# Patient Record
Sex: Female | Born: 1975 | Race: White | Hispanic: No | Marital: Married | Smoking: Never smoker
Health system: Southern US, Community
[De-identification: ages and names within clinical notes are randomized; demographics above are authoritative.]

## PROBLEM LIST (undated history)

## (undated) DIAGNOSIS — K219 Gastro-esophageal reflux disease without esophagitis: Secondary | ICD-10-CM

## (undated) DIAGNOSIS — N390 Urinary tract infection, site not specified: Secondary | ICD-10-CM

## (undated) DIAGNOSIS — T7840XA Allergy, unspecified, initial encounter: Secondary | ICD-10-CM

## (undated) DIAGNOSIS — D649 Anemia, unspecified: Secondary | ICD-10-CM

## (undated) HISTORY — PX: FOOT SURGERY: SHX648

## (undated) HISTORY — DX: Urinary tract infection, site not specified: N39.0

## (undated) HISTORY — PX: ABDOMINAL HYSTERECTOMY: SHX81

## (undated) HISTORY — DX: Gastro-esophageal reflux disease without esophagitis: K21.9

## (undated) HISTORY — DX: Allergy, unspecified, initial encounter: T78.40XA

## (undated) HISTORY — DX: Anemia, unspecified: D64.9

---

## 1999-08-29 ENCOUNTER — Other Ambulatory Visit: Admission: RE | Admit: 1999-08-29 | Discharge: 1999-08-29 | Payer: Self-pay | Admitting: Obstetrics and Gynecology

## 1999-12-15 ENCOUNTER — Other Ambulatory Visit: Admission: RE | Admit: 1999-12-15 | Discharge: 1999-12-15 | Payer: Self-pay | Admitting: Obstetrics and Gynecology

## 2000-01-09 ENCOUNTER — Encounter (INDEPENDENT_AMBULATORY_CARE_PROVIDER_SITE_OTHER): Payer: Self-pay

## 2000-01-09 ENCOUNTER — Other Ambulatory Visit: Admission: RE | Admit: 2000-01-09 | Discharge: 2000-01-09 | Payer: Self-pay | Admitting: Obstetrics and Gynecology

## 2000-05-03 ENCOUNTER — Other Ambulatory Visit: Admission: RE | Admit: 2000-05-03 | Discharge: 2000-05-03 | Payer: Self-pay | Admitting: Obstetrics and Gynecology

## 2000-10-01 ENCOUNTER — Other Ambulatory Visit: Admission: RE | Admit: 2000-10-01 | Discharge: 2000-10-01 | Payer: Self-pay | Admitting: Obstetrics and Gynecology

## 2001-10-18 ENCOUNTER — Other Ambulatory Visit: Admission: RE | Admit: 2001-10-18 | Discharge: 2001-10-18 | Payer: Self-pay | Admitting: Obstetrics and Gynecology

## 2002-10-31 ENCOUNTER — Other Ambulatory Visit: Admission: RE | Admit: 2002-10-31 | Discharge: 2002-10-31 | Payer: Self-pay | Admitting: Obstetrics and Gynecology

## 2003-09-02 ENCOUNTER — Other Ambulatory Visit: Admission: RE | Admit: 2003-09-02 | Discharge: 2003-09-02 | Payer: Self-pay | Admitting: Obstetrics and Gynecology

## 2004-02-19 ENCOUNTER — Inpatient Hospital Stay (HOSPITAL_COMMUNITY): Admission: AD | Admit: 2004-02-19 | Discharge: 2004-02-19 | Payer: Self-pay | Admitting: Obstetrics and Gynecology

## 2004-03-20 ENCOUNTER — Inpatient Hospital Stay (HOSPITAL_COMMUNITY): Admission: AD | Admit: 2004-03-20 | Discharge: 2004-03-20 | Payer: Self-pay | Admitting: Obstetrics and Gynecology

## 2004-03-21 ENCOUNTER — Inpatient Hospital Stay (HOSPITAL_COMMUNITY): Admission: AD | Admit: 2004-03-21 | Discharge: 2004-03-24 | Payer: Self-pay | Admitting: Obstetrics and Gynecology

## 2004-04-29 ENCOUNTER — Other Ambulatory Visit: Admission: RE | Admit: 2004-04-29 | Discharge: 2004-04-29 | Payer: Self-pay | Admitting: Obstetrics and Gynecology

## 2004-06-17 ENCOUNTER — Encounter: Admission: RE | Admit: 2004-06-17 | Discharge: 2004-06-17 | Payer: Self-pay | Admitting: Obstetrics and Gynecology

## 2005-01-13 ENCOUNTER — Ambulatory Visit: Payer: Self-pay | Admitting: Family Medicine

## 2006-02-23 ENCOUNTER — Ambulatory Visit: Payer: Self-pay | Admitting: Internal Medicine

## 2007-06-05 ENCOUNTER — Encounter (INDEPENDENT_AMBULATORY_CARE_PROVIDER_SITE_OTHER): Payer: Self-pay | Admitting: Obstetrics and Gynecology

## 2007-06-05 ENCOUNTER — Ambulatory Visit (HOSPITAL_COMMUNITY): Admission: AD | Admit: 2007-06-05 | Discharge: 2007-06-05 | Payer: Self-pay | Admitting: Obstetrics and Gynecology

## 2007-09-17 DIAGNOSIS — K219 Gastro-esophageal reflux disease without esophagitis: Secondary | ICD-10-CM

## 2007-11-06 ENCOUNTER — Ambulatory Visit: Payer: Self-pay | Admitting: Family Medicine

## 2007-12-03 ENCOUNTER — Ambulatory Visit: Payer: Self-pay | Admitting: Family Medicine

## 2007-12-03 DIAGNOSIS — J018 Other acute sinusitis: Secondary | ICD-10-CM

## 2007-12-13 ENCOUNTER — Telehealth: Payer: Self-pay | Admitting: Family Medicine

## 2008-05-18 ENCOUNTER — Inpatient Hospital Stay (HOSPITAL_COMMUNITY): Admission: RE | Admit: 2008-05-18 | Discharge: 2008-05-20 | Payer: Self-pay | Admitting: Obstetrics and Gynecology

## 2008-09-08 ENCOUNTER — Ambulatory Visit: Payer: Self-pay | Admitting: Family Medicine

## 2008-09-08 DIAGNOSIS — D239 Other benign neoplasm of skin, unspecified: Secondary | ICD-10-CM | POA: Insufficient documentation

## 2008-12-10 ENCOUNTER — Ambulatory Visit: Payer: Self-pay | Admitting: Family Medicine

## 2008-12-10 DIAGNOSIS — J019 Acute sinusitis, unspecified: Secondary | ICD-10-CM

## 2009-04-26 ENCOUNTER — Ambulatory Visit: Payer: Self-pay | Admitting: Family Medicine

## 2009-04-26 DIAGNOSIS — J209 Acute bronchitis, unspecified: Secondary | ICD-10-CM

## 2009-12-23 ENCOUNTER — Ambulatory Visit: Payer: Self-pay | Admitting: Internal Medicine

## 2009-12-23 DIAGNOSIS — J020 Streptococcal pharyngitis: Secondary | ICD-10-CM

## 2009-12-23 LAB — CONVERTED CEMR LAB: Rapid Strep: POSITIVE

## 2010-12-28 ENCOUNTER — Other Ambulatory Visit: Payer: Self-pay | Admitting: Family Medicine

## 2010-12-28 ENCOUNTER — Ambulatory Visit
Admission: RE | Admit: 2010-12-28 | Discharge: 2010-12-28 | Payer: Self-pay | Source: Home / Self Care | Attending: Family Medicine | Admitting: Family Medicine

## 2010-12-28 DIAGNOSIS — D509 Iron deficiency anemia, unspecified: Secondary | ICD-10-CM | POA: Insufficient documentation

## 2010-12-28 DIAGNOSIS — D649 Anemia, unspecified: Secondary | ICD-10-CM | POA: Insufficient documentation

## 2010-12-28 LAB — CBC WITH DIFFERENTIAL/PLATELET
Basophils Absolute: 0 10*3/uL (ref 0.0–0.1)
Basophils Relative: 0.5 % (ref 0.0–3.0)
Eosinophils Absolute: 0 10*3/uL (ref 0.0–0.7)
Eosinophils Relative: 0.3 % (ref 0.0–5.0)
HCT: 32.5 % — ABNORMAL LOW (ref 36.0–46.0)
Hemoglobin: 11 g/dL — ABNORMAL LOW (ref 12.0–15.0)
Lymphocytes Relative: 17 % (ref 12.0–46.0)
Lymphs Abs: 0.8 10*3/uL (ref 0.7–4.0)
MCHC: 33.8 g/dL (ref 30.0–36.0)
MCV: 78.5 fl (ref 78.0–100.0)
Monocytes Absolute: 0.6 10*3/uL (ref 0.1–1.0)
Monocytes Relative: 13.2 % — ABNORMAL HIGH (ref 3.0–12.0)
Neutro Abs: 3.2 10*3/uL (ref 1.4–7.7)
Neutrophils Relative %: 69 % (ref 43.0–77.0)
Platelets: 208 10*3/uL (ref 150.0–400.0)
RBC: 4.14 Mil/uL (ref 3.87–5.11)
RDW: 15.8 % — ABNORMAL HIGH (ref 11.5–14.6)
WBC: 4.7 10*3/uL (ref 4.5–10.5)

## 2010-12-29 ENCOUNTER — Telehealth: Payer: Self-pay | Admitting: Family Medicine

## 2011-01-10 NOTE — Assessment & Plan Note (Signed)
Summary: st/congestion/njr   Vital Signs:  Patient profile:   35 year old female Weight:      165 pounds Temp:     98.8 degrees F oral BP sitting:   122 / 84  (left arm) Cuff size:   regular  Vitals Entered By: Raechel Ache, RN (December 23, 2009 1:43 PM) CC: Sick since Sunday with fever 102, aches, sore throat   CC:  Sick since Sunday with fever 102, aches, and sore throat.  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  Her husband has a similar acute febrile illness.  She does have a prior history of streptococcal pharyngitis.  Rapid strep today.  Positive  Allergies: 1)  ! Pcn 2)  ! Sulfa  Past History:  Past Medical History: Reviewed history from 09/17/2007 and no changes required. UTI Sinusitis GERD  Physical Exam  General:  Well-developed,well-nourished,in no acute distress; alert,appropriate and cooperative throughout examination Head:  Normocephalic and atraumatic without obvious abnormalities. No apparent alopecia or balding. Eyes:  No corneal or conjunctival inflammation noted. EOMI. Perrla. Funduscopic exam benign, without hemorrhages, exudates or papilledema. Vision grossly normal. Ears:  External ear exam shows no significant lesions or deformities.  Otoscopic examination reveals clear canals, tympanic membranes are intact bilaterally without bulging, retraction, inflammation or discharge. Hearing is grossly normal bilaterally. Mouth:  pharyngeal erythema.  pharyngeal erythema.   Neck:  No deformities, masses, or tenderness noted. Lungs:  Normal respiratory effort, chest expands symmetrically. Lungs are clear to auscultation, no crackles or wheezes. Heart:  Normal rate and regular rhythm. S1 and S2 normal without gallop, murmur, click, rub or other extra sounds.   Impression & Recommendations:  Problem # 1:  PHARYNGITIS, STREPTOCOCCAL, ACUTE (ICD-034.0)  Her  updated medication list for this problem includes:    Clarithromycin 500 Mg Tabs (Clarithromycin) ..... One twice daily for 7 days    Amoxicillin 500 Mg Caps (Amoxicillin) ..... One capsule 3  times daily with meals  Her updated medication list for this problem includes:    Clarithromycin 500 Mg Tabs (Clarithromycin) ..... One twice daily for 7 days    Amoxicillin 500 Mg Caps (Amoxicillin) ..... One capsule 3  times daily with meals  Complete Medication List: 1)  Daily Vitamins Tabs (Multiple vitamin) .... Take one tab once daily 2)  Proair Hfa 108 (90 Base) Mcg/act Aers (Albuterol sulfate) .... 2 puffs q 4 hours as needed 3)  Clarithromycin 500 Mg Tabs (Clarithromycin) .... One twice daily for 7 days 4)  Amoxicillin 500 Mg Caps (Amoxicillin) .... One capsule 3  times daily with meals  Other Orders: Rapid Strep ([address])  Patient Instructions: 1)  Take 400-600mg  of Ibuprofen (Advil, Motrin) with food every 4-6 hours as needed for relief of pain or comfort of fever. 2)  Take your antibiotic as prescribed until ALL of it is gone, but stop if you develop a rash or swelling and contact our office as soon as possible. Prescriptions: AMOXICILLIN 500 MG CAPS (AMOXICILLIN) one capsule 3  times daily with meals  #21 x 0   Entered and Authorized by:   Gordy Savers  MD   Signed by:   Gordy Savers  MD on 12/23/2009   Method used:   Print then Give to Patient   RxID:   [id number] AMOXICILLIN 500 MG CAPS (AMOXICILLIN) one capsule 3  times daily with meals  #21 x 0   Entered and  Authorized by:   Gordy Savers  MD   Signed by:   Gordy Savers  MD on 12/23/2009   Method used:   Electronically to        [facility] (retail)       [address]       [place], [place]  [address]       Ph: [phone]       Fax: [phone]   RxID:   [id number] CLARITHROMYCIN 500 MG TABS (CLARITHROMYCIN) one twice daily for 7 days  #14 x 0   Entered  and Authorized by:   Gordy Savers  MD   Signed by:   Gordy Savers  MD on 12/23/2009   Method used:   Print then Give to Patient   RxID:   [id number]   Laboratory Results    Other Tests  Rapid Strep: positive Comments: Joanne Chars CMA  December 23, 2009 1:57 PM

## 2011-01-12 NOTE — Progress Notes (Signed)
Summary: switch antibiotic  Phone Note Call from Patient Call back at Home Phone [phone]   Caller: Patient Call For: Nelwyn Salisbury MD Summary of Call: Pt is called for lab results, and wants to know if to change meds since her throat feels worse. Initial call taken by: [facility] CMA AAMA,  December 29, 2010 3:05 PM  Follow-up for Phone Call        White blood count nl. Is mildly anemic. would continue abx and Dr. Clent Ridges to review tomorrow Follow-up by: Edwyna Perfect MD,  December 29, 2010 4:00 PM  Additional Follow-up for Phone Call Additional follow up Details #1::        Notified pt. Additional Follow-up by: Lynann Beaver CMA AAMA,  December 29, 2010 4:14 PM    Additional Follow-up for Phone Call Additional follow up Details #2::    Stop Keflex and switch to Doxycycline. Call in 100 mg two times a day for 10 days  Follow-up by: Nelwyn Salisbury MD,  December 30, 2010 1:27 PM  Additional Follow-up for Phone Call Additional follow up Details #3:: Details for Additional Follow-up Action Taken: left mess to return call. ........  Marland KitchenPura Spice, RN  December 30, 2010 2:15 PM spoke with pt rx called to [facility] .............Marland KitchenPura Spice, RN  December 30, 2010 3:35 PM  Additional Follow-up by: Pura Spice, RN,  December 30, 2010 3:34 PM  New/Updated Medications: DOXYCYCLINE HYCLATE 100 MG CAPS (DOXYCYCLINE HYCLATE) Take 1 tab twice a day Prescriptions: DOXYCYCLINE HYCLATE 100 MG CAPS (DOXYCYCLINE HYCLATE) Take 1 tab twice a day  #20 x 0   Entered by:   Pura Spice, RN   Authorized by:   Nelwyn Salisbury MD   Signed by:   Pura Spice, RN on 12/30/2010   Method used:   Electronically to        [facility] (retail)       [address]       [place], [place]  [address]       Ph: [phone]       Fax: [phone]   RxID:   [id number]

## 2011-01-12 NOTE — Assessment & Plan Note (Signed)
Summary: sorethroat/body aches/?strep/fever/cjr   Vital Signs:  Patient profile:   35 year old female Weight:      166 pounds BMI:     25.33 O2 Sat:      99 % Temp:     100 degrees F Pulse rate:   93 / minute BP sitting:   122 / 78  (left arm) Cuff size:   regular  Vitals Entered By: Pura Spice, RN (December 28, 2010 10:43 AM) CC: chills achy sore throat wants hemoglobin ck's states her children has had strep past 2 wks    History of Present Illness: Here for 2 weeks of generalized fatigue, and then the onset 5 days ago of fevers, aches, HA, and a ST. No cough or NVD. No rashes. One of her children developed test positive strep throat  a week ago, and another child developed hand, foot, and mouth disease a few days ago. On fluids and Motrin. She does have a hx of anemia.   Allergies: 1)  ! Sulfa  Past History:  Past Medical History: UTI Sinusitis GERD Anemia-iron deficiency  Review of Systems  The patient denies anorexia, weight loss, weight gain, vision loss, decreased hearing, hoarseness, chest pain, syncope, dyspnea on exertion, peripheral edema, prolonged cough, hemoptysis, abdominal pain, melena, hematochezia, severe indigestion/heartburn, hematuria, incontinence, genital sores, muscle weakness, suspicious skin lesions, transient blindness, difficulty walking, depression, unusual weight change, abnormal bleeding, enlarged lymph nodes, angioedema, breast masses, and testicular masses.    Physical Exam  General:  Well-developed,well-nourished,in no acute distress; alert,appropriate and cooperative throughout examination Head:  Normocephalic and atraumatic without obvious abnormalities. No apparent alopecia or balding. Eyes:  No corneal or conjunctival inflammation noted. EOMI. Perrla. Funduscopic exam benign, without hemorrhages, exudates or papilledema. Vision grossly normal. Ears:  External ear exam shows no significant lesions or deformities.  Otoscopic examination  reveals clear canals, tympanic membranes are intact bilaterally without bulging, retraction, inflammation or discharge. Hearing is grossly normal bilaterally. Nose:  External nasal examination shows no deformity or inflammation. Nasal mucosa are pink and moist without lesions or exudates. Mouth:  OP is a bit red with no exudates Neck:  No deformities, masses, or tenderness noted. Lungs:  Normal respiratory effort, chest expands symmetrically. Lungs are clear to auscultation, no crackles or wheezes.   Impression & Recommendations:  Problem # 1:  PHARYNGITIS, STREPTOCOCCAL, ACUTE (ICD-034.0)  The following medications were removed from the medication list:    Clarithromycin 500 Mg Tabs (Clarithromycin) ..... One twice daily for 7 days    Amoxicillin 500 Mg Caps (Amoxicillin) ..... One capsule 3  times daily with meals Her updated medication list for this problem includes:    Keflex 500 Mg Caps (Cephalexin) .Marland Kitchen... Three times a day  Problem # 2:  ANEMIA (ICD-285.9)  Orders: Venipuncture ([address]) TLB-CBC Platelet - w/Differential (85025-CBCD)  Complete Medication List: 1)  Daily Vitamins Tabs (Multiple vitamin) .... Take one tab once daily 2)  Proair Hfa 108 (90 Base) Mcg/act Aers (Albuterol sulfate) .... 2 puffs q 4 hours as needed 3)  Keflex 500 Mg Caps (Cephalexin) .... Three times a day  Patient Instructions: 1)  Please schedule a follow-up appointment as needed .  Prescriptions: KEFLEX 500 MG CAPS (CEPHALEXIN) three times a day  #30 x 0   Entered and Authorized by:   Nelwyn Salisbury MD   Signed by:   Nelwyn Salisbury MD on 12/28/2010   Method used:   Electronically to  [facility]  [address] [phone][facility] (retail)       [address] [place], [place]  [address]       Ph: [phone] or [phone]       Fax: [phone]   RxID:   [id number]    Orders Added: 1)  Venipuncture [66440] 2)  TLB-CBC Platelet - w/Differential [85025-CBCD] 3)  Est. Patient Level IV  [34742]  Appended Document: Orders Update    Clinical Lists Changes  Orders: Added new Service order of Specimen Handling (59563) - Signed

## 2011-04-25 NOTE — Op Note (Signed)
NAME:  Christy Chang, Christy Chang                  ACCOUNT NO.:  [id number]   MEDICAL RECORD NO.:  [id number]          PATIENT TYPE:  AMB   LOCATION:  SDC                           FACILITY:  [facility]   PHYSICIAN:  Michelle L. Grewal, M.D.DATE OF BIRTH:  [birth year]   DATE OF PROCEDURE:  06/05/2007  DATE OF DISCHARGE:                               OPERATIVE REPORT   PREOPERATIVE DIAGNOSIS:  Incomplete abortion.   POSTOPERATIVE DIAGNOSIS:  Incomplete abortion.   PROCEDURE:  Dilatation and evacuation.   SURGEON:  Dr. Vincente Poli.   ANESTHESIA:  MAC with paracervical.   SPECIMENS:  Products of conception.   ESTIMATED BLOOD LOSS:  About 1000 mL preoperatively,  minimal EBL at the  time of surgery.   COMPLICATIONS:  None.   PROCEDURE:  The patient is a 35 year old, G2, P1 who presented to the  office yesterday for a routine OB visit.  Ultrasound revealed missed AB  measuring about 8 to 9 weeks.  The patient was scheduled to have a D&E  today at 12:00. However, this morning at approximately 2:00 a.m., she  started having acute onset of severe lower abdominal pain with heavy  bleeding.  The bleeding was so significant that when she arrived to the  triage area at approximate 8:00 morning she had blood that was caked to  her toes and feet.  Upon arrival, we went ahead and started an IV and  drew a CBC.  We went ahead and took her back to the operating room  approximately 3 hours early because of the significant bleeding.  After  the patient was taken to the operating room, she was then given  anesthesia and placed in the lithotomy position.  She was prepped and  draped.  The entire vagina was full of significant clots, approximately  500-700 mL.  Those clots were removed.  A speculum was placed in the  vagina, and the cervix was grasped with a tenaculum.  A bladder amount  of products of conception were noted to be extruding from the cervical  os.  Those were gently teased out using ring forceps.  A  paracervical  block was performed.  The cervix was about 2 cm dilated.  A #7 suction  cannula was inserted into the uterus.  The uterus was thoroughly  curetted, suctioned of tissue with products consistent with products of  conception.  We then removed the suction cannula, and a sharp curettage  was performed.  All of the uterine cavity appeared to be clean.  A final  suction curettage was performed with scant tissue obtained.  There was  no vaginal bleeding noted at the end of this.  Approximately half of the  tissue was sent for  chromosome analysis.  The other half was sent for routine pathologic  analysis.  All instruments were removed from the vagina.  Vaginal  bleeding was minimal.  Sponge, lap and instrument counts were correct  x2.  The patient went to recovery room in stable condition.      Michelle L. Vincente Poli, M.D.  Electronically Signed     MLG/MEDQ  D:  06/05/2007  T:  06/05/2007  Job:  [id number]

## 2011-06-29 ENCOUNTER — Encounter: Payer: Self-pay | Admitting: Family Medicine

## 2011-06-29 ENCOUNTER — Ambulatory Visit (INDEPENDENT_AMBULATORY_CARE_PROVIDER_SITE_OTHER): Payer: BC Managed Care – PPO | Admitting: Family Medicine

## 2011-06-29 VITALS — BP 104/68 | HR 89 | Temp 98.9°F | Wt 165.0 lb

## 2011-06-29 DIAGNOSIS — J40 Bronchitis, not specified as acute or chronic: Secondary | ICD-10-CM

## 2011-06-29 MED ORDER — AZITHROMYCIN 250 MG PO TABS: ORAL_TABLET | ORAL | Status: AC

## 2011-06-29 MED ORDER — HYDROCODONE-HOMATROPINE 5-1.5 MG/5ML PO SYRP: 5.0000 mL | ORAL_SOLUTION | ORAL | Status: AC | PRN

## 2011-06-29 NOTE — Progress Notes (Signed)
  Subjective:    Patient ID: Christy Chang, female    DOB: 1976/04/18, 35 y.o.   MRN: [id number]  HPI Here for one week of chest tightness and a dry cough. No fever. She started with head congestion but this has resolved.   Review of Systems  Constitutional: Negative.   HENT: Negative.   Eyes: Negative.   Respiratory: Positive for cough and chest tightness.        Objective:   Physical Exam  Constitutional: She appears well-developed and well-nourished.  HENT:  Right Ear: External ear normal.  Left Ear: External ear normal.  Nose: Nose normal.  Mouth/Throat: Oropharynx is clear and moist. No oropharyngeal exudate.  Eyes: Conjunctivae are normal. Pupils are equal, round, and reactive to light.  Neck: Normal range of motion. Neck supple.  Pulmonary/Chest: Effort normal and breath sounds normal.  Lymphadenopathy:    She has no cervical adenopathy.          Assessment & Plan:  Rest, fluids

## 2011-07-31 ENCOUNTER — Telehealth: Payer: Self-pay | Admitting: *Deleted

## 2011-07-31 NOTE — Telephone Encounter (Signed)
Pt has severe dental phobia, and would like Dr. Clent Ridges to call in Valium or some RX to help her with anxiety on her next dental appt.

## 2011-08-01 MED ORDER — DIAZEPAM 5 MG PO TABS: 5.0000 mg | ORAL_TABLET | Freq: Three times a day (TID) | ORAL | Status: AC | PRN

## 2011-08-01 NOTE — Telephone Encounter (Signed)
Call in valium 5 mg to take q 6 hours prn anxiety, #30 with no rf

## 2011-08-01 NOTE — Telephone Encounter (Signed)
Script was called in and left message for pt.

## 2011-09-07 LAB — CBC
Hemoglobin: 11.7 — ABNORMAL LOW
MCHC: 33.8
MCV: 79.4
Platelets: 240
RDW: 15.8 — ABNORMAL HIGH
RDW: 16 — ABNORMAL HIGH
WBC: 11.9 — ABNORMAL HIGH

## 2011-09-27 LAB — CBC
HCT: 33.1 — ABNORMAL LOW
Platelets: 271
WBC: 8.4

## 2011-09-27 LAB — SAMPLE TO BLOOD BANK

## 2011-11-09 ENCOUNTER — Telehealth: Payer: Self-pay | Admitting: Family Medicine

## 2011-11-09 NOTE — Telephone Encounter (Signed)
She needs an OV to discuss this  

## 2011-11-09 NOTE — Telephone Encounter (Signed)
Pt called and said that she had discussed leg pain a while back and pt said that she is experiencing numbness and tingling in both legs. Pt is wondering what Dr Clent Ridges would recommend for pain? Pls call in script to [facility] in [place]. If pt needs ov, pls advise.

## 2011-11-09 NOTE — Telephone Encounter (Signed)
Left voice message.

## 2011-11-14 ENCOUNTER — Ambulatory Visit (INDEPENDENT_AMBULATORY_CARE_PROVIDER_SITE_OTHER): Payer: BC Managed Care – PPO | Admitting: Family Medicine

## 2011-11-14 ENCOUNTER — Encounter: Payer: Self-pay | Admitting: Family Medicine

## 2011-11-14 VITALS — BP 124/84 | HR 99 | Temp 98.7°F | Wt 171.0 lb

## 2011-11-14 DIAGNOSIS — G629 Polyneuropathy, unspecified: Secondary | ICD-10-CM

## 2011-11-14 DIAGNOSIS — J329 Chronic sinusitis, unspecified: Secondary | ICD-10-CM

## 2011-11-14 DIAGNOSIS — G589 Mononeuropathy, unspecified: Secondary | ICD-10-CM

## 2011-11-14 LAB — CBC WITH DIFFERENTIAL/PLATELET
Basophils Relative: 0.5 % (ref 0.0–3.0)
Eosinophils Relative: 4.9 % (ref 0.0–5.0)
Hemoglobin: 10.7 g/dL — ABNORMAL LOW (ref 12.0–15.0)
Lymphocytes Relative: 50.7 % — ABNORMAL HIGH (ref 12.0–46.0)
Monocytes Relative: 11.9 % (ref 3.0–12.0)
Neutro Abs: 0.9 10*3/uL — ABNORMAL LOW (ref 1.4–7.7)
Neutrophils Relative %: 32 % — ABNORMAL LOW (ref 43.0–77.0)
RBC: 4.02 Mil/uL (ref 3.87–5.11)
WBC: 3 10*3/uL — ABNORMAL LOW (ref 4.5–10.5)

## 2011-11-14 LAB — BASIC METABOLIC PANEL
GFR: 130.68 mL/min (ref >60.00)
Glucose, Bld: 90 mg/dL (ref 70–99)
Potassium: 4 mEq/L (ref 3.5–5.1)
Sodium: 139 mEq/L (ref 135–145)

## 2011-11-14 LAB — TSH: TSH: 0.72 u[IU]/mL (ref 0.35–5.50)

## 2011-11-14 MED ORDER — AZITHROMYCIN 250 MG PO TABS: ORAL_TABLET | ORAL | Status: AC

## 2011-11-14 NOTE — Progress Notes (Signed)
  Subjective:    Patient ID: Christy Chang, female    DOB: [birth year], 35 y.o.   MRN: [id number]  HPI Here for 2 issues. First 6 months ago she developed burning and tingling in both lower legs from the knees down which is worse when she sits down and better when she is up walking around. No swelling. She does have some intermittent low back pain. Also for 5 days she has had a stuffy head, PND, and coughing up green sputum. No fever.    Review of Systems  Constitutional: Negative.   HENT: Positive for congestion, postnasal drip and sinus pressure.   Eyes: Negative.   Respiratory: Positive for cough.   Neurological: Negative.        Objective:   Physical Exam  Constitutional: She is oriented to person, place, and time. She appears well-developed and well-nourished.  HENT:  Right Ear: External ear normal.  Left Ear: External ear normal.  Nose: Nose normal.  Mouth/Throat: Oropharynx is clear and moist. No oropharyngeal exudate.  Eyes: Conjunctivae are normal.  Neck: No thyromegaly present.  Pulmonary/Chest: Effort normal and breath sounds normal.  Musculoskeletal: She exhibits no edema and no tenderness.  Lymphadenopathy:    She has no cervical adenopathy.  Neurological: She is alert and oriented to person, place, and time. No cranial nerve deficit.          Assessment & Plan:  The leg tingling seems to be a mechanical pinching of the legs, so I advised her to get more exercise and to lose weight. Get labs to rule out neurolpathy. Treat the sinusitis with a Zpack.

## 2011-11-16 ENCOUNTER — Telehealth: Payer: Self-pay | Admitting: Family Medicine

## 2011-11-16 NOTE — Telephone Encounter (Signed)
PT requesting results of labs please contact

## 2011-11-17 ENCOUNTER — Encounter: Payer: Self-pay | Admitting: Family Medicine

## 2011-11-17 MED ORDER — FERROUS SULFATE 325 (65 FE) MG PO TABS: 325.0000 mg | ORAL_TABLET | Freq: Two times a day (BID) | ORAL | Status: DC

## 2011-11-17 NOTE — Progress Notes (Signed)
Quick Note:  Spoke with pt, sent script e-scribe, put a copy of results in mail. ______

## 2011-11-17 NOTE — Progress Notes (Signed)
Addended by: Aniceto Boss A on: 11/17/2011 08:27 AM   Modules accepted: Orders

## 2011-11-17 NOTE — Telephone Encounter (Signed)
Spoke with pt and gave results. 

## 2011-11-21 ENCOUNTER — Telehealth: Payer: Self-pay

## 2011-11-21 ENCOUNTER — Other Ambulatory Visit: Payer: Self-pay

## 2011-11-21 NOTE — Telephone Encounter (Signed)
Pt was seen last week and has finished her z pak but still has a bad cough. Pt would like to know what she can take during the day so that she can function.Pls advise.

## 2011-11-23 NOTE — Telephone Encounter (Signed)
Left message on machine For pt abourt delsym- was instructed to call back if she doesn't improve

## 2011-11-23 NOTE — Telephone Encounter (Signed)
Take Delsym OTC

## 2011-12-06 ENCOUNTER — Encounter: Payer: Self-pay | Admitting: Family Medicine

## 2011-12-06 ENCOUNTER — Ambulatory Visit (INDEPENDENT_AMBULATORY_CARE_PROVIDER_SITE_OTHER): Payer: BC Managed Care – PPO | Admitting: Family Medicine

## 2011-12-06 VITALS — BP 110/60 | Temp 98.6°F | Wt 172.0 lb

## 2011-12-06 DIAGNOSIS — J40 Bronchitis, not specified as acute or chronic: Secondary | ICD-10-CM

## 2011-12-06 DIAGNOSIS — R05 Cough: Secondary | ICD-10-CM

## 2011-12-06 MED ORDER — PREDNISONE 20 MG PO TABS: 20.0000 mg | ORAL_TABLET | Freq: Every day | ORAL | Status: AC

## 2011-12-06 MED ORDER — GUAIFENESIN-CODEINE 100-10 MG/5ML PO SYRP: 5.0000 mL | ORAL_SOLUTION | Freq: Three times a day (TID) | ORAL | Status: AC | PRN

## 2011-12-06 NOTE — Progress Notes (Signed)
  Subjective:    Patient ID: Christy Chang, female    DOB: 16-Dec-1975, 35 y.o.   MRN: [id number]  HPI 35 year old white female, nonsmoker, patient of Dr. Clent Ridges is in today with 3 weeks of cough. She was seen December 4 and treated with a Z-Pak for bronchitis and has been taking Delsym. Overall, her congestion is much better but the cough continues to linger.   Review of Systems  Constitutional: Negative.   HENT: Negative.   Eyes: Negative.   Respiratory: Positive for cough.   Cardiovascular: Negative.   Genitourinary: Negative.   Psychiatric/Behavioral: Negative.    Past Medical History  Diagnosis Date  . Allergy   . GERD (gastroesophageal reflux disease)   . Anemia   . UTI (lower urinary tract infection)     History   Social History  . Marital Status: Married    Spouse Name: N/A    Number of Children: N/A  . Years of Education: N/A   Occupational History  . Not on file.   Social History Main Topics  . Smoking status: Never Smoker   . Smokeless tobacco: Never Used  . Alcohol Use: 0.5 oz/week    1 drink(s) per week  . Drug Use: No  . Sexually Active: Not on file   Other Topics Concern  . Not on file   Social History Narrative  . No narrative on file    Past Surgical History  Procedure Date  . Foot surgery     Family History  Problem Relation Age of Onset  . Multiple sclerosis    . Cancer      colon 1st degree relative <60  . Hypertension    . Kidney disease    . Lung cancer    . Ulcers      Allergies  Allergen Reactions  . Sulfa Antibiotics     Current Outpatient Prescriptions on File Prior to Visit  Medication Sig Dispense Refill  . ferrous sulfate 325 (65 FE) MG tablet Take 1 tablet (325 mg total) by mouth 2 (two) times daily.  60 tablet  11  . Multiple Vitamin (MULTIVITAMIN) tablet Take 1 tablet by mouth daily.        . diazepam (VALIUM) 5 MG tablet Take 1 tablet (5 mg total) by mouth every 8 (eight) hours as needed for anxiety or sleep.  30  tablet  0    BP 110/60  Temp(Src) 98.6 F (37 C) (Oral)  Wt 172 lb (78.019 kg)  LMP 12/02/2012chart   Objective:   Physical Exam  Constitutional: She is oriented to person, place, and time.  HENT:  Head: Normocephalic.  Right Ear: External ear normal.  Left Ear: External ear normal.  Neck: Normal range of motion. Neck supple.  Cardiovascular: Normal rate, regular rhythm and normal heart sounds.   Pulmonary/Chest: Effort normal and breath sounds normal.  Musculoskeletal: Normal range of motion.  Neurological: She is alert and oriented to person, place, and time.  Skin: Skin is warm and dry.          Assessment & Plan:  Assessment: Acute bronchitis  Plan: Advised patient that in treating bronchitis her cough can linger several weeks beyond antibiotic therapy. Prednisone 60 mg every morning x5 days. Robitussin-AC 1 teaspoon 3 times a day when necessary cough. Warned of drowsiness. Call if symptoms worsen or persist. Recheck as scheduled or when necessary.

## 2011-12-06 NOTE — Patient Instructions (Signed)

## 2012-04-27 ENCOUNTER — Ambulatory Visit (INDEPENDENT_AMBULATORY_CARE_PROVIDER_SITE_OTHER): Payer: BC Managed Care – PPO | Admitting: Internal Medicine

## 2012-04-27 VITALS — BP 121/80 | HR 71 | Temp 98.1°F | Resp 18 | Ht 65.75 in | Wt 185.0 lb

## 2012-04-27 DIAGNOSIS — R05 Cough: Secondary | ICD-10-CM

## 2012-04-27 MED ORDER — HYDROCODONE-ACETAMINOPHEN 7.5-500 MG/15ML PO SOLN: 5.0000 mL | Freq: Four times a day (QID) | ORAL | Status: AC | PRN

## 2012-04-27 NOTE — Progress Notes (Signed)
  Subjective:    Patient ID: Christy Chang, female    DOB: 09-14-1976, 36 y.o.   MRN: [id number]  HPI 1 week of coughing, phx of bronchospasm. No fever, sob,asthma. Sputum clear   Review of Systems     Objective:   Physical Exam Lungs clear all fields      Assessment & Plan:  Rx cough

## 2012-04-27 NOTE — Patient Instructions (Signed)

## 2012-09-02 ENCOUNTER — Other Ambulatory Visit: Payer: Self-pay | Admitting: Obstetrics and Gynecology

## 2013-11-03 ENCOUNTER — Ambulatory Visit (INDEPENDENT_AMBULATORY_CARE_PROVIDER_SITE_OTHER): Payer: BC Managed Care – PPO | Admitting: Family Medicine

## 2013-11-03 ENCOUNTER — Encounter: Payer: Self-pay | Admitting: Family Medicine

## 2013-11-03 VITALS — BP 112/68 | HR 111 | Temp 98.8°F | Wt 197.0 lb

## 2013-11-03 DIAGNOSIS — J209 Acute bronchitis, unspecified: Secondary | ICD-10-CM

## 2013-11-03 MED ORDER — AZITHROMYCIN 250 MG PO TABS: ORAL_TABLET | ORAL | Status: DC

## 2013-11-03 NOTE — Progress Notes (Signed)
Pre visit review using our clinic review tool, if applicable. No additional management support is needed unless otherwise documented below in the visit note. 

## 2013-11-03 NOTE — Progress Notes (Signed)
  Subjective:    Patient ID: Christy Chang, female    DOB: [birth year], 37 y.o.   MRN: [id number]  HPI Here for 10 days of chest congestion, hoarseness and coughing up yellow sputum. On Mucinex.    Review of Systems  Constitutional: Negative.   HENT: Positive for congestion and postnasal drip.   Eyes: Negative.   Respiratory: Positive for cough and shortness of breath.   Cardiovascular: Negative.        Objective:   Physical Exam  Constitutional: She appears well-developed and well-nourished.  HENT:  Right Ear: External ear normal.  Left Ear: External ear normal.  Nose: Nose normal.  Mouth/Throat: Oropharynx is clear and moist.  Eyes: Conjunctivae are normal.  Pulmonary/Chest: Effort normal and breath sounds normal.  Lymphadenopathy:    She has no cervical adenopathy.          Assessment & Plan:  Use Delsym prn

## 2013-12-15 ENCOUNTER — Encounter: Payer: Self-pay | Admitting: Family Medicine

## 2013-12-15 ENCOUNTER — Telehealth: Payer: Self-pay | Admitting: Family Medicine

## 2013-12-15 ENCOUNTER — Ambulatory Visit (INDEPENDENT_AMBULATORY_CARE_PROVIDER_SITE_OTHER): Payer: BC Managed Care – PPO | Admitting: Family Medicine

## 2013-12-15 VITALS — BP 116/70 | HR 103 | Temp 99.3°F | Wt 190.0 lb

## 2013-12-15 DIAGNOSIS — J209 Acute bronchitis, unspecified: Secondary | ICD-10-CM

## 2013-12-15 MED ORDER — HYDROCOD POLST-CHLORPHEN POLST 10-8 MG/5ML PO LQCR: 5.0000 mL | Freq: Two times a day (BID) | ORAL | Status: DC | PRN

## 2013-12-15 MED ORDER — AZITHROMYCIN 250 MG PO TABS: ORAL_TABLET | ORAL | Status: DC

## 2013-12-15 NOTE — Progress Notes (Signed)
Pre visit review using our clinic review tool, if applicable. No additional management support is needed unless otherwise documented below in the visit note. 

## 2013-12-15 NOTE — Telephone Encounter (Signed)
She needs to keep this appt. She was seen over 5 weeks ago and needs to be reassessed

## 2013-12-15 NOTE — Telephone Encounter (Signed)
Can you call pt?

## 2013-12-15 NOTE — Telephone Encounter (Signed)
Pt was seen 11/24 and diagnosed with bronchitis, states she is not feeling better.  Appt was schedule with PCP today at 4pm.  However, pt wants to know if she indeed needs to be seen again or if something can be called in.

## 2013-12-15 NOTE — Telephone Encounter (Signed)
Pt.notified

## 2013-12-15 NOTE — Progress Notes (Signed)
   Subjective:    Patient ID: Christy Chang, female    DOB: [birth year], 38 y.o.   MRN: [id number]  HPI Here for one week of chest congestion and coughing up green sputum. This started with fevers and body aches which went away.    Review of Systems  Constitutional: Negative.   HENT: Negative.   Eyes: Negative.   Respiratory: Positive for cough and chest tightness.        Objective:   Physical Exam  Constitutional: She appears well-developed and well-nourished.  HENT:  Right Ear: External ear normal.  Left Ear: External ear normal.  Nose: Nose normal.  Mouth/Throat: Oropharynx is clear and moist.  Eyes: Conjunctivae are normal.  Pulmonary/Chest: Effort normal. No respiratory distress. She has no wheezes. She has no rales.  Scattered rhonchi   Lymphadenopathy:    She has no cervical adenopathy.          Assessment & Plan:  Add Mucinex

## 2014-03-17 ENCOUNTER — Other Ambulatory Visit: Payer: Self-pay | Admitting: Obstetrics and Gynecology

## 2014-09-11 ENCOUNTER — Telehealth: Payer: Self-pay | Admitting: Family Medicine

## 2014-09-11 NOTE — Telephone Encounter (Signed)
Looks like a FYI 

## 2014-09-11 NOTE — Telephone Encounter (Signed)
Patient Information:  Caller Name: Marymargaret  Phone: [phone]  Patient: Christy Chang, Christy Chang  Gender: Female  DOB: 04-10-1976  Age: 38 Years  PCP: Alysia Penna [facility])  Pregnant: No  Office Follow Up:  Does the office need to follow up with this patient?: No  Instructions For The Office: N/A   Symptoms  Reason For Call & Symptoms: Stung by yellow jacket on 09/09/14 on upper inside of R arm. Area was red and swollen and last night woke up and hand was numb and tingling. Afebrile. Today numbness is better but it is still swollen. Redness has improved.  Reviewed Health History In EMR: Yes  Reviewed Medications In EMR: Yes  Reviewed Allergies In EMR: Yes  Reviewed Surgeries / Procedures: Yes  Date of Onset of Symptoms: 09/09/2014  Treatments Tried: Benedryl 25 mgs 1 PO x1  Treatments Tried Worked: No OB / GYN:  LMP: 08/21/2014  Guideline(s) Used:  Bee Sting  Disposition Per Guideline:   Home Care  Reason For Disposition Reached:   Normal local reaction to bee, wasp, or yellow jacket sting  Advice Given:  Try to Remove the Stinger (if present):  In many cases no stinger will be present. Only bees leave their stingers. Wasps, yellow jackets, and hornets do not.  Apply Cold to the Area for Pain - Cold Pack Method:  Wrap a bag of ice in a towel (or use a bag of frozen vegetables such as peas).  Apply this cold pack to the area of the sting for 10-20 minutes.  You may repeat this as needed, to relieve symptoms of pain and swelling.  Pain Medicines:  For pain relief, you can take either acetaminophen, ibuprofen, or naproxen.  Ibuprofen (e.g., Motrin, Advil):  Take 400 mg (two 200 mg pills) by mouth every 6 hours.  Another choice is to take 600 mg (three 200 mg pills) by mouth every 8 hours.  The most you should take each day is 1,200 mg (six 200 mg pills), unless your doctor has told you to take more.  Hydrocortisone Cream for Itching:  Hydrocortisone cream applied to the sting  area 4 times a day can also help reduce itching. Use it for a couple days until the itch is mild.  Available over-the-counter in [place] as 0.5% and 1% cream.  Antihistamine Medication for Itching:  If the sting becomes very itchy, you can take diphenhydramine (e.g., Benadryl). The adult dosage 25-50 mg by mouth every 6 hours on an as needed basis.  Expected Course:  Pain: Severe pain or burning at the site lasts 1 to 2 hours. Pain after this period is usually minimal. Itching often follows the pain.  Redness and Swelling: Normal redness and swelling from the venom can increase for 24 hours following the sting. Redness at the sting site is normal. It doesn't mean that it is infected. The redness can last 3 days and the swelling 7 days.  Stings only rarely get infected.  Call Back If:  Difficulty breathing or swallowing (generally develops within the first 2 hours after the sting; call 911)  Swelling becomes huge  Sting begins to look infected  You become worse.  Patient Will Follow Care Advice:  YES

## 2014-09-11 NOTE — Telephone Encounter (Signed)
noted 

## 2014-10-06 ENCOUNTER — Ambulatory Visit (INDEPENDENT_AMBULATORY_CARE_PROVIDER_SITE_OTHER): Payer: BC Managed Care – PPO | Admitting: Family Medicine

## 2014-10-06 ENCOUNTER — Encounter: Payer: Self-pay | Admitting: Family Medicine

## 2014-10-06 VITALS — BP 127/85 | HR 92 | Temp 98.9°F | Ht 65.75 in | Wt 205.0 lb

## 2014-10-06 DIAGNOSIS — M542 Cervicalgia: Secondary | ICD-10-CM

## 2014-10-06 MED ORDER — CYCLOBENZAPRINE HCL 10 MG PO TABS: 10.0000 mg | ORAL_TABLET | Freq: Three times a day (TID) | ORAL | Status: DC | PRN

## 2014-10-06 NOTE — Progress Notes (Signed)
Pre visit review using our clinic review tool, if applicable. No additional management support is needed unless otherwise documented below in the visit note. 

## 2014-10-06 NOTE — Progress Notes (Signed)
   Subjective:    Patient ID: Christy Chang, female    DOB: 18-Sep-1976, 38 y.o.   MRN: [id number]  HPI Here for worsening numbness, tingling and weakness in the right arm. This started one year ago but it has gotten worse the past 2 weeks. No real pain is present. She has started to use her left hand (the non-dominant hand) for daily tasks such as opening jars because her grip strength is fading. She has had stiffness and mild pain in the neck and upper back for years, and she often sees a massage therapist for this. She has also seen a chiropractor a few times. No problems in the left arm or in either leg.    Review of Systems  Respiratory: Negative.   Cardiovascular: Negative.   Neurological: Positive for weakness and numbness. Negative for dizziness, tremors, seizures, syncope, facial asymmetry, speech difficulty, light-headedness and headaches.       Objective:   Physical Exam  Constitutional: She is oriented to person, place, and time. She appears well-developed and well-nourished.  Cardiovascular: Normal rate, regular rhythm, normal heart sounds and intact distal pulses.   Pulmonary/Chest: Effort normal and breath sounds normal.  Musculoskeletal:  She has some spasm in the neck and upper back but ROM of the spine is full. The lower portion of the neck is tender  Neurological: She is alert and oriented to person, place, and time. She has normal reflexes. No cranial nerve deficit. She exhibits normal muscle tone. Coordination normal.  Sensation to light touch is mildly decreased in the right hand, and the right hand grip is slightly weaker than the left          Assessment & Plan:  It seems she has a pinched nerve in the lower cervical spine area, possibly C6 or C7. We will set up an MRI of this area soon. Try heat and Flexeril prn

## 2014-10-19 ENCOUNTER — Telehealth: Payer: Self-pay | Admitting: Family Medicine

## 2014-10-19 ENCOUNTER — Ambulatory Visit
Admission: RE | Admit: 2014-10-19 | Discharge: 2014-10-19 | Disposition: A | Discharge status: Home / Self Care | Payer: BC Managed Care – PPO | Source: Ambulatory Visit | Attending: Family Medicine | Admitting: Family Medicine

## 2014-10-19 DIAGNOSIS — M542 Cervicalgia: Secondary | ICD-10-CM

## 2014-10-19 MED ORDER — DIAZEPAM 5 MG PO TABS: ORAL_TABLET | ORAL | Status: DC

## 2014-10-19 NOTE — Telephone Encounter (Signed)
Duplicate note

## 2014-10-19 NOTE — Telephone Encounter (Signed)
Call in Valium 5 mg to take one or two tabs about one hour prior to procedure, #30 with no rf

## 2014-10-19 NOTE — Telephone Encounter (Signed)
Patient calling to report she is scheduled for MRI this afternoon at 4pm and she was under the impression this was an open MRI due to her anxiety.  Pt is requesting an rx for anxiety to take prior to appt.  Pharmacy:  [facility] in [place].  States she was given Valium in the past for dental procedure but this was several years ago and medication is expired.

## 2014-10-19 NOTE — Telephone Encounter (Signed)
I spoke with pt and she is already on her way out to appointment.

## 2014-10-21 NOTE — Addendum Note (Signed)
Addended by: Alysia Penna A on: 10/21/2014 09:47 AM   Modules accepted: Orders

## 2015-07-07 ENCOUNTER — Other Ambulatory Visit: Payer: Self-pay | Admitting: Obstetrics and Gynecology

## 2015-07-08 LAB — CYTOLOGY - PAP

## 2016-01-25 ENCOUNTER — Ambulatory Visit (INDEPENDENT_AMBULATORY_CARE_PROVIDER_SITE_OTHER): Payer: BC Managed Care – PPO | Admitting: Family Medicine

## 2016-01-25 VITALS — BP 122/76 | HR 117 | Temp 99.1°F | Resp 16 | Ht 65.0 in | Wt 189.0 lb

## 2016-01-25 DIAGNOSIS — J209 Acute bronchitis, unspecified: Secondary | ICD-10-CM

## 2016-01-25 MED ORDER — AZITHROMYCIN 250 MG PO TABS: ORAL_TABLET | ORAL | Status: DC

## 2016-01-25 MED ORDER — HYDROCODONE-HOMATROPINE 5-1.5 MG/5ML PO SYRP: 5.0000 mL | ORAL_SOLUTION | Freq: Three times a day (TID) | ORAL | Status: DC | PRN

## 2016-01-25 MED ORDER — ALBUTEROL SULFATE 108 (90 BASE) MCG/ACT IN AEPB: 2.0000 | INHALATION_SPRAY | Freq: Four times a day (QID) | RESPIRATORY_TRACT | Status: DC | PRN

## 2016-01-25 NOTE — Progress Notes (Signed)
By signing my name below, I, Moises Blood, attest that this documentation has been prepared under the direction and in the presence of Robyn Haber, MD. Electronically Signed: Moises Blood, [place]. 01/25/2016 , 3:52 PM .  Patient was seen in room 9 .   Patient ID: Christy Chang MRN: [id number], DOB: [birth year], 40 y.o. Date of Encounter: 01/25/2016  Primary Physician: Laurey Morale, MD  Chief Complaint:  Chief Complaint  Patient presents with  . Cough    x 1 week  . Generalized Body Aches  . Nasal Congestion    HPI:  Christy Chang is a 40 y.o. female who presents to [facility] complaining of a cough that started a week ago. She started to feel better but the symptoms returned. Now, she also has some myalgia and nasal congestion. She had a fever a few days ago but this has resolved. She had a borderline asthma when she was a child.   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   Past Medical History  Diagnosis Date  . Allergy   . GERD (gastroesophageal reflux disease)   . Anemia   . UTI (lower urinary tract infection)      Home Meds: Prior to Admission medications   Medication Sig Start Date End Date Taking? Authorizing Provider  cyclobenzaprine (FLEXERIL) 10 MG tablet Take 1 tablet (10 mg total) by mouth 3 (three) times daily as needed for muscle spasms. Patient not taking: Reported on 01/25/2016 10/06/14   Laurey Morale, MD  diazepam (VALIUM) 5 MG tablet Take 1-2 tablets one hour before procedure. Patient not taking: Reported on 01/25/2016 10/19/14   Laurey Morale, MD  Multiple Vitamin (MULTIVITAMIN) tablet Take 1 tablet by mouth daily. Reported on 01/25/2016    Historical Provider, MD    Allergies:  Allergies  Allergen Reactions  . Sulfa Antibiotics     Social History   Social History  . Marital Status: Married    Spouse Name: N/A  . Number of Children: N/A  . Years of Education: N/A    Occupational History  . Not on file.   Social History Main Topics  . Smoking status: Never Smoker   . Smokeless tobacco: Never Used  . Alcohol Use: 0.5 oz/week    1 drink(s) per week  . Drug Use: No  . Sexual Activity: Yes   Other Topics Concern  . Not on file   Social History Narrative     Review of Systems: Constitutional: negative for fever, chills, night sweats, weight changes, or fatigue  HEENT: negative for vision changes, hearing loss, rhinorrhea, epistaxis, or sinus pressure; positive for sore throat, congestion Cardiovascular: negative for chest pain or palpitations Respiratory: negative for hemoptysis, wheezing, shortness of breath; positive for cough Abdominal: negative for abdominal pain, nausea, vomiting, diarrhea, or constipation Dermatological: negative for rash Musc: positive for myalgia (general body) Neurologic: negative for headache, dizziness, or syncope All other systems reviewed and are otherwise negative with the exception to those above and in the HPI.  Physical Exam: Blood pressure 122/76, pulse 117, temperature 99.1 F (37.3 C), resp. rate 16, height 5\' 5"  (1.651 m), weight 189 lb (85.73 kg), SpO2 99 %., Body mass index is 31.45 kg/(m^2). General: Well developed, well nourished, in no acute distress. Head: Normocephalic, atraumatic, eyes without discharge, sclera non-icteric, nares are without discharge. Bilateral auditory canals clear, TM's are without perforation, pearly grey and translucent with reflective cone of light  bilaterally. Oral cavity moist, posterior pharynx without exudate, erythema, peritonsillar abscess, or post nasal drip.  Neck: Supple. No thyromegaly. Full ROM. No lymphadenopathy. Lungs: Clear bilaterally to auscultation without wheezes, or rales. Breathing is unlabored; few rhonchi, violent cough Heart: RRR with S1 S2. No murmurs, rubs, or gallops appreciated. Msk:  Strength and tone normal for age. Extremities/Skin: Warm and  dry. No clubbing or cyanosis. No edema. No rashes or suspicious lesions. Neuro: Alert and oriented X 3. Moves all extremities spontaneously. Gait is normal. CNII-XII grossly in tact. Psych:  Responds to questions appropriately with a normal affect.   Labs:  ASSESSMENT AND PLAN:  40 y.o. year old female with  This chart was scribed in my presence and reviewed by me personally.    ICD-9-CM ICD-10-CM   1. Acute bronchitis, unspecified organism 466.0 J20.9 azithromycin (ZITHROMAX) 250 MG tablet     Albuterol Sulfate (PROAIR RESPICLICK) 123XX123 (90 Base) MCG/ACT AEPB     HYDROcodone-homatropine (HYCODAN) 5-1.5 MG/5ML syrup     Signed, Robyn Haber, MD     Signed, Robyn Haber, MD 01/25/2016 3:52 PM

## 2016-01-25 NOTE — Patient Instructions (Signed)

## 2016-02-02 ENCOUNTER — Ambulatory Visit (INDEPENDENT_AMBULATORY_CARE_PROVIDER_SITE_OTHER): Payer: BC Managed Care – PPO | Admitting: Family Medicine

## 2016-02-02 ENCOUNTER — Encounter: Payer: Self-pay | Admitting: Family Medicine

## 2016-02-02 VITALS — BP 115/76 | HR 101 | Ht 65.0 in | Wt 187.0 lb

## 2016-02-02 DIAGNOSIS — J209 Acute bronchitis, unspecified: Secondary | ICD-10-CM | POA: Diagnosis not present

## 2016-02-02 MED ORDER — BENZONATATE 200 MG PO CAPS: 200.0000 mg | ORAL_CAPSULE | Freq: Two times a day (BID) | ORAL | Status: DC | PRN

## 2016-02-02 MED ORDER — METHYLPREDNISOLONE ACETATE 80 MG/ML IJ SUSP
160.0000 mg | Freq: Once | INTRAMUSCULAR | Status: AC
  Administered 2016-02-02: 160 mg via INTRAMUSCULAR

## 2016-02-02 MED ORDER — AMOXICILLIN-POT CLAVULANATE 875-125 MG PO TABS: 1.0000 | ORAL_TABLET | Freq: Two times a day (BID) | ORAL | Status: DC

## 2016-02-02 NOTE — Progress Notes (Signed)
Pre visit review using our clinic review tool, if applicable. No additional management support is needed unless otherwise documented below in the visit note. 

## 2016-02-03 ENCOUNTER — Encounter: Payer: Self-pay | Admitting: Family Medicine

## 2016-02-03 NOTE — Progress Notes (Signed)
   Subjective:    Patient ID: Christy Chang, female    DOB: May 04, 1976, 40 y.o.   MRN: [id number]  HPI Here to follow up an Urgent Care visit on 01-25-16 for bronchitis. For the past 2 weeks she has had chest congestion, wheezing, and a dry cough. At the UC she was given a Zpack, some Hycodan syrup, and a Proair inhaler. The cough is really no better however. The syrup helps her sleep at night but she cannot use this during the day while she is working. No fever or chest pain.    Review of Systems  Constitutional: Negative.   HENT: Positive for congestion and postnasal drip. Negative for sinus pressure and sore throat.   Eyes: Negative.   Respiratory: Positive for cough, chest tightness, shortness of breath and wheezing.   Cardiovascular: Negative for chest pain.       Objective:   Physical Exam  Constitutional: She appears well-developed and well-nourished.  Coughing frequently  HENT:  Right Ear: External ear normal.  Left Ear: External ear normal.  Nose: Nose normal.  Mouth/Throat: Oropharynx is clear and moist.  Eyes: Conjunctivae are normal.  Neck: No thyromegaly present.  Cardiovascular: Normal rate, regular rhythm, normal heart sounds and intact distal pulses.   Pulmonary/Chest: She has no rales.  Diffuse rhonchi and wheezes   Lymphadenopathy:    She has no cervical adenopathy.          Assessment & Plan:  Partially treated bronchitis. Given Augmentin and Benzonatate to use during the day when working. Use Hycodan at night. Also given a steroid shot. Recheck prn

## 2016-05-16 IMAGING — MR MR CERVICAL SPINE W/O CM
4 of 6 series · 22 of 48 positions shown · non-contrast
Comparison: None.

CLINICAL DATA: Neck pain. Right arm numbness and tingling and
weakness

EXAM:
MRI CERVICAL SPINE WITHOUT CONTRAST
TECHNIQUE: Multiplanar, multisequence MR imaging of the cervical spine was
performed. No intravenous contrast was administered.

[Series 2: T2 · sagittal · 3.0mm · 0.39mm/px · 5 of 12 slices shown (1 of 3)]
[im 1/12]
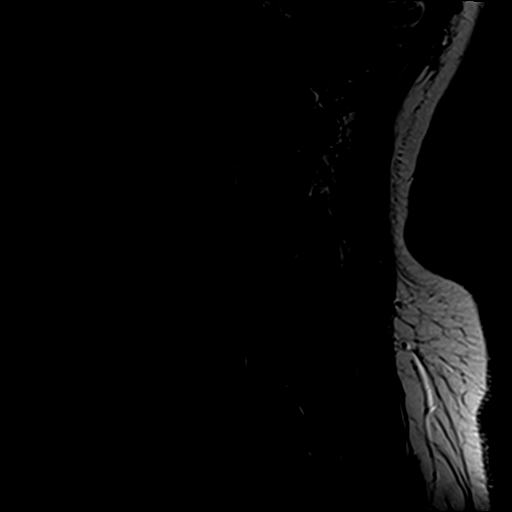
[im 3/12]
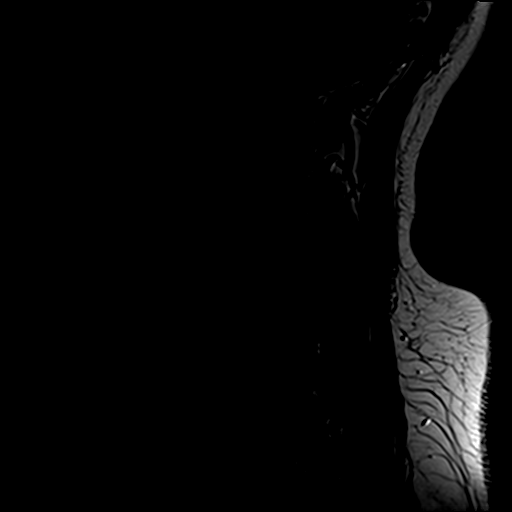
[im 6/12]
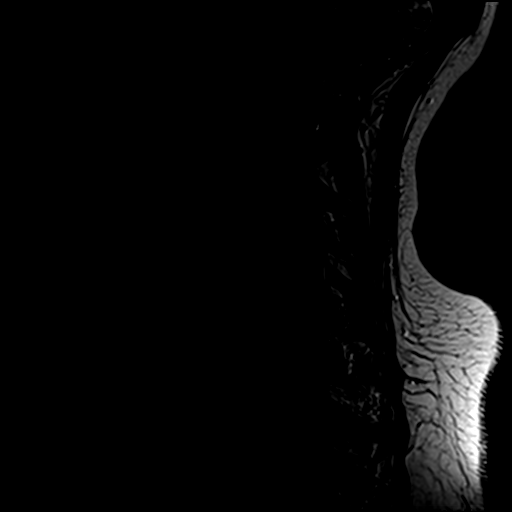
[im 9/12]
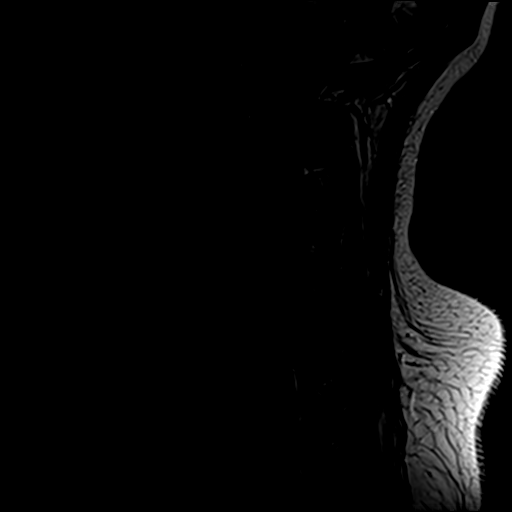
[im 12/12]
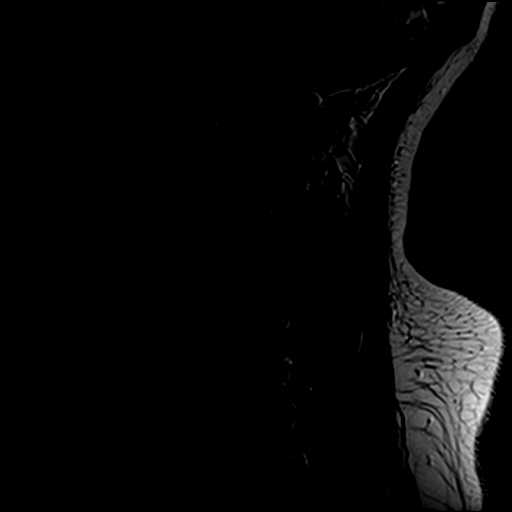

[Series 3: T1 · sagittal · 3.0mm · 0.39mm/px · 3 of 12 slices shown]
[im 1/12]
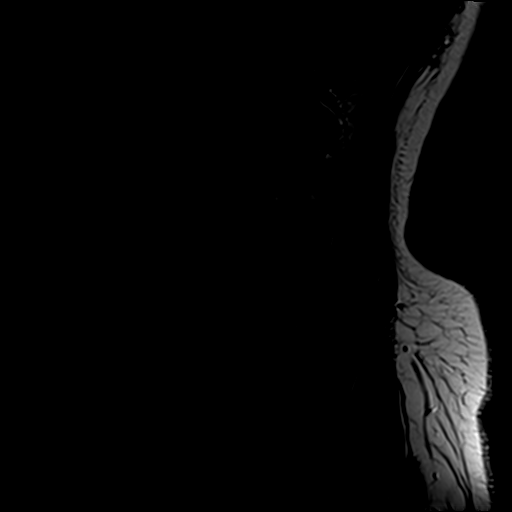
[im 6/12]
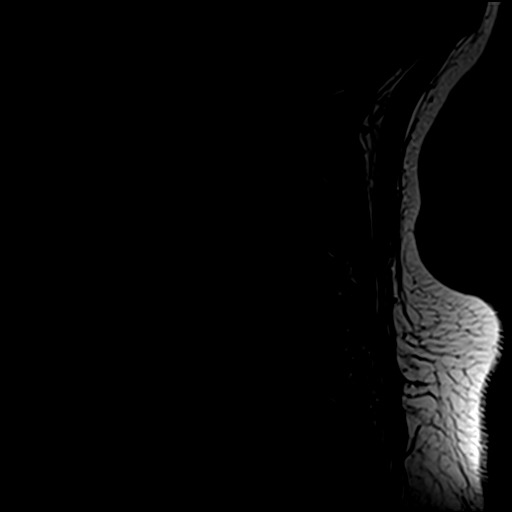
[im 12/12]
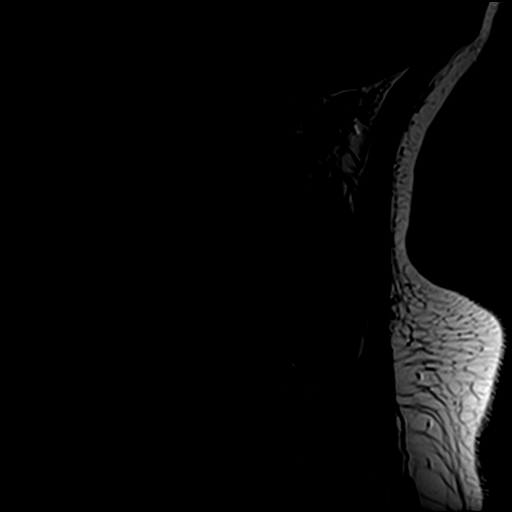

[Series 5: T2 · axial · 3.0mm · 0.39mm/px · z∈[-67,+27]mm · 11 of 26 slices shown (2 of 3)]
[im 1/26]
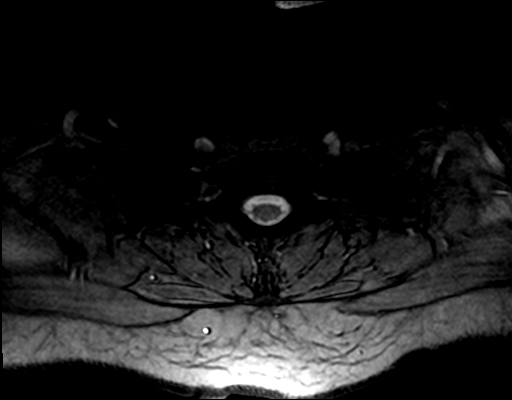
[im 3/26]
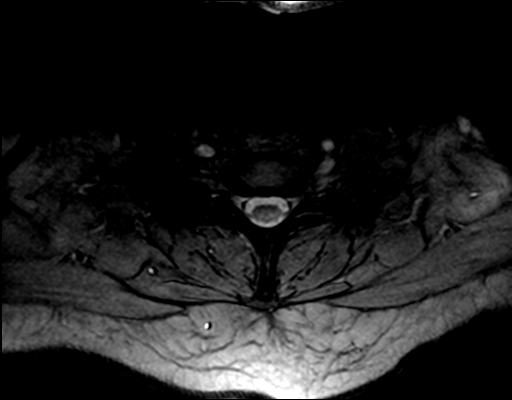
[im 6/26]
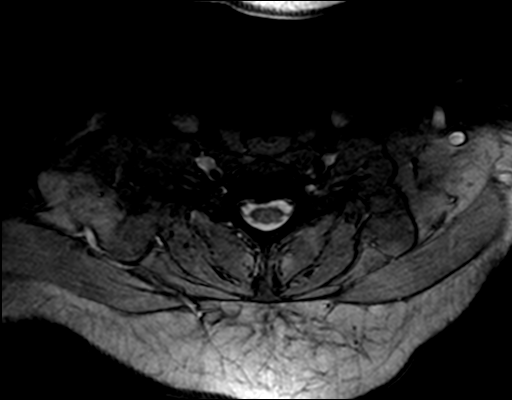
[im 8/26]
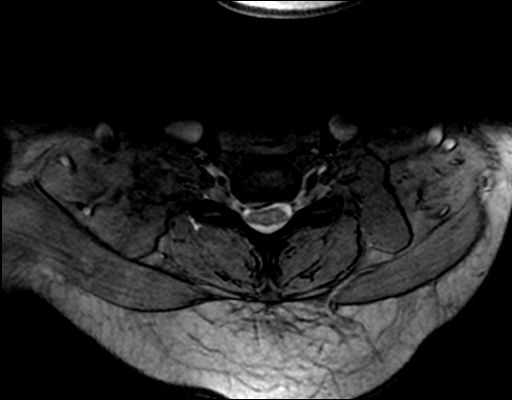
[im 11/26]
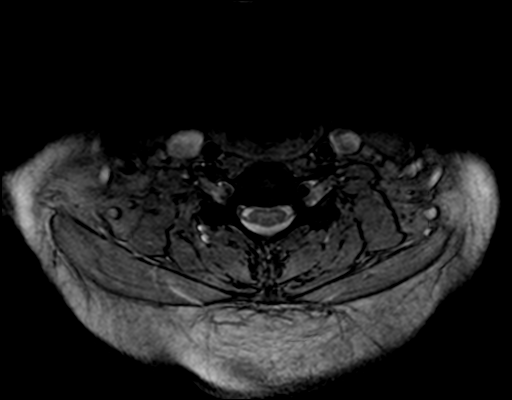
[im 13/26]
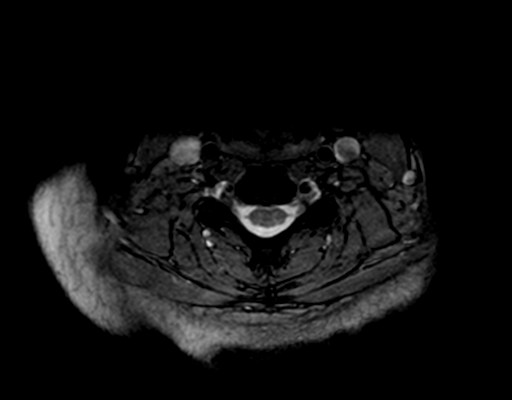
[im 16/26]
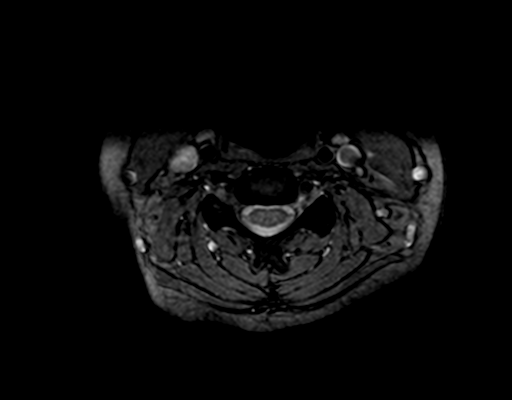
[im 18/26]
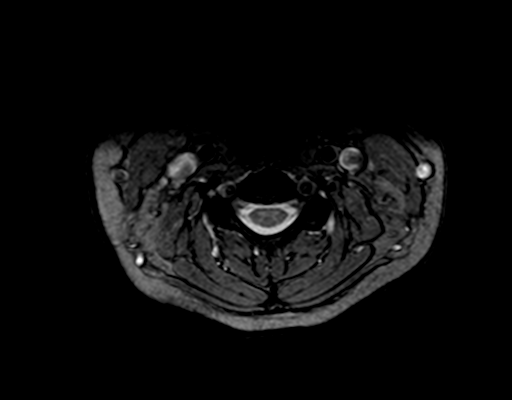
[im 21/26]
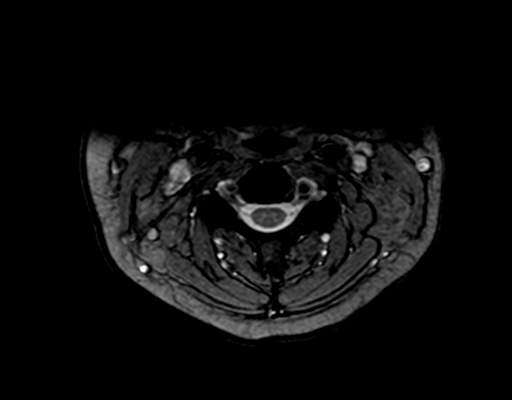
[im 23/26]
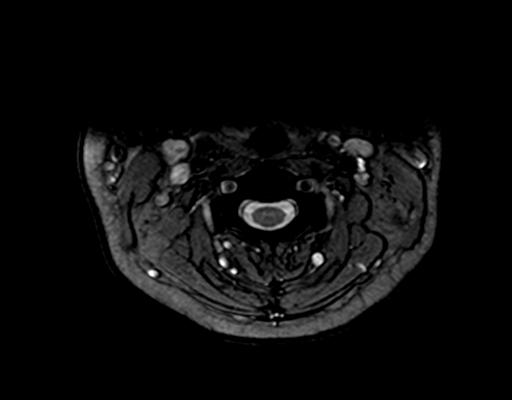
[im 26/26]
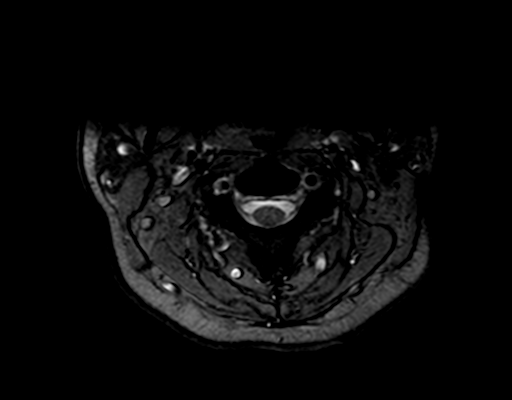

[Series 6: T2 · axial · 3.0mm · 0.39mm/px · z∈[-60,+16]mm · 3 of 26 slices shown (3 of 3)]
[im 3/26]
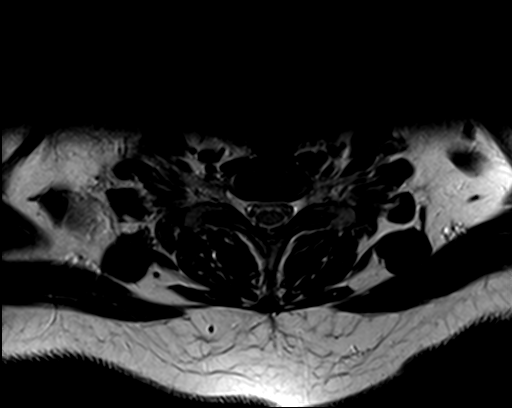
[im 13/26]
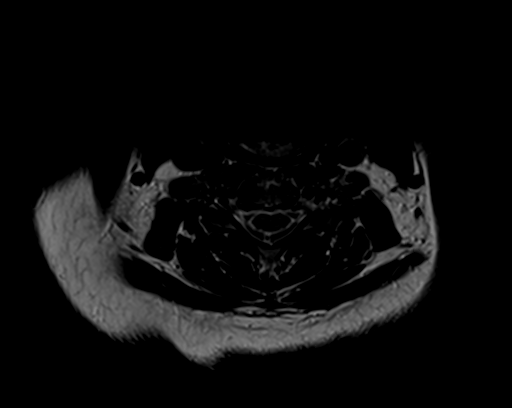
[im 23/26]
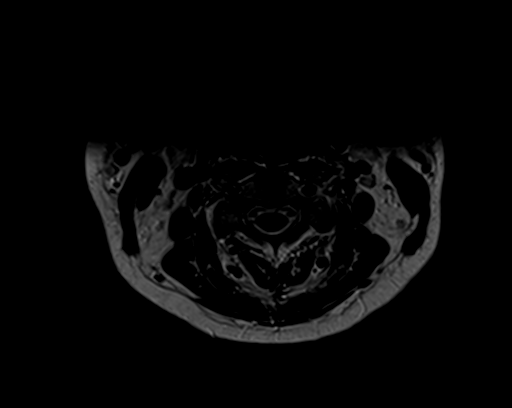

[22 of 48 positions shown; findings below may reference images not displayed]

FINDINGS: Normal cervical alignment. Reversal of the cervical lordosis with
mild kyphosis. Negative for fracture or mass. Negative for cord
compression. Spinal cord signal is normal. Craniocervical junction
is normal.

C2-3:  Negative

C3-4:  Negative

C4-5:  Negative

C5-6:  Mild disc bulging without significant stenosis

C5-6: Right-sided disc protrusion extending into the foramen.
Flattening of the cord on the right with narrowing of the canal on
the right side. Mild right foraminal narrowing. Left foramen widely
patent

C7-T1:  Negative
IMPRESSION: Small right-sided disc protrusion C5-6 with cord deformity and right
foraminal encroachment.

## 2017-09-28 ENCOUNTER — Telehealth: Payer: Self-pay | Admitting: Family Medicine

## 2017-09-28 NOTE — Telephone Encounter (Signed)
[facility] - Client [facility]  Patient Name: Christy Chang  DOB: [birth year]    Initial Comment Caller states she was at work this morning and she started seeing spots and felt out of it. Has been drinking peanut butter and cracker and water and feeling better but still wanted to call and speak with someone    Nurse Assessment  Nurse: Wynetta Emery, RN, Baker Janus Date/Time Eilene Ghazi Time): 09/28/2017 10:49:01 AM  Confirm and document reason for call. If symptomatic, describe symptoms. ---Psalm states she had spots in her eyes and felt sick; (had not eaten today ate banana crackers and 3 bottles of water consumed) symptoms subsiding but still not better.  Does the patient have any new or worsening symptoms? ---Yes  Will a triage be completed? ---Yes  Related visit to physician within the last 2 weeks? ---No  Does the PT have any chronic conditions? (i.e. diabetes, asthma, etc.) ---No  Is the patient pregnant or possibly pregnant? (Ask all females between the ages of 62-55) ---No  Is this a behavioral health or substance abuse call? ---No     Guidelines    Guideline Title Affirmed Question Affirmed Notes  Vision Loss or Change [1] Brief (now gone) blurred vision AND [2] unexplained    Final Disposition User   See PCP When Office is Open (within 3 days) Wynetta Emery, Therapist, sports, [place] Janus    Comments  NOTE states feeling better working can't be seen today will call [facility] at [facility] tomorrow and get appt.   Referrals  REFERRED TO PCP OFFICE  [facility]   Caller Disagree/Comply Comply  Caller Understands Yes  PreDisposition Call Doctor

## 2017-10-09 ENCOUNTER — Ambulatory Visit (INDEPENDENT_AMBULATORY_CARE_PROVIDER_SITE_OTHER): Payer: BC Managed Care – PPO | Admitting: Family Medicine

## 2017-10-09 ENCOUNTER — Encounter: Payer: Self-pay | Admitting: Family Medicine

## 2017-10-09 VITALS — BP 122/68 | HR 84 | Temp 98.7°F | Ht 65.0 in | Wt 191.4 lb

## 2017-10-09 DIAGNOSIS — M5431 Sciatica, right side: Secondary | ICD-10-CM

## 2017-10-09 MED ORDER — CYCLOBENZAPRINE HCL 10 MG PO TABS: 10.0000 mg | ORAL_TABLET | Freq: Three times a day (TID) | ORAL | 2 refills | Status: DC | PRN

## 2017-10-09 MED ORDER — METHYLPREDNISOLONE 4 MG PO TBPK: ORAL_TABLET | ORAL | 0 refills | Status: DC

## 2017-10-09 NOTE — Patient Instructions (Addendum)
 .  fas

## 2017-10-09 NOTE — Progress Notes (Signed)
   Subjective:    Patient ID: Christy Chang, female    DOB: [birth year], 41 y.o.   MRN: [id number]  HPI Here for one month of pain in the right lower back that radiates down the right leg to the knee. She also has numbness and tingling in the right upper leg. No recent trauma. She has tried ice and Ibuprofen with poor results.    Review of Systems  Constitutional: Negative.   Respiratory: Negative.   Cardiovascular: Negative.   Musculoskeletal: Positive for back pain.       Objective:   Physical Exam  Constitutional:  In pain, limping   Cardiovascular: Normal rate, regular rhythm, normal heart sounds and intact distal pulses.   Pulmonary/Chest: Effort normal and breath sounds normal. No respiratory distress. She has no wheezes. She has no rales.  Musculoskeletal:  Tender in the right lower back and over the right sciatic notch. ROM is full. Negative SLR.           Assessment & Plan:  Sciatica. Use heat and stretches. Given a Medrol dose pack and Flexeril. Recheck prn. Alysia Penna, MD

## 2017-10-10 ENCOUNTER — Ambulatory Visit: Payer: BC Managed Care – PPO | Admitting: Family Medicine

## 2017-11-13 ENCOUNTER — Telehealth: Payer: Self-pay | Admitting: Family Medicine

## 2017-11-13 NOTE — Telephone Encounter (Signed)
Called pt and left a VM to call back to discuss her concerns

## 2017-11-13 NOTE — Telephone Encounter (Signed)
Copied from [place] #[id number]. Topic: General - Other >> Nov 13, 2017  7:47 AM Christy Chang wrote: Reason for CRM: patient calling wanting someone to give her a call back she was seen on 10-10-17 for sciatica pain and she is still having it and wanted to know if she need to come back into the clinic

## 2017-11-14 NOTE — Telephone Encounter (Signed)
Called and spoke with the pt she stated that she has been dealing with the back and sciatica pain since October. Pt stated that she was seen for this issue on 10/09/2017 and was treated and felt much better. However once she was done with the medication her symptoms started to creep back up. Pt stated that she doesn't care much to take medication but she has been taking the ibuprofen and the flexeril when needed.  Pt stated that she is just concern and she was told to advise Dr. Sarajane Jews if her pain came back. Pt wanted to know should she schedule for another OV or did Dr. Sarajane Jews have any other suggestions? Pt stated that she has had to sit on a yoga ball at work in order to sit. Sent to PCP

## 2017-11-14 NOTE — Telephone Encounter (Signed)
Called pt's cell phone and left a VM that pt will need to schedule for an OV.

## 2017-11-14 NOTE — Telephone Encounter (Signed)
Have her come back in for an OV

## 2017-11-14 NOTE — Telephone Encounter (Signed)
Pt wants to know if Dr Sarajane Jews wants to see her again, or have anything else done.  Pt states now her back is hurting and may be from her walking funny. Pt states she cannot get much relief.   Please call back asap to advise  [phone]  Please do not call the home phone as she is at work

## 2017-11-14 NOTE — Telephone Encounter (Signed)
Copied from [place] #[id number]. Topic: General - Other >> Nov 14, 2017  8:08 AM Christy Chang wrote:   Pt said her call can be returned at  [phone] she said someone called her home she need the call on her cell

## 2018-02-27 ENCOUNTER — Ambulatory Visit: Payer: Self-pay

## 2018-02-27 ENCOUNTER — Telehealth: Payer: Self-pay | Admitting: Family Medicine

## 2018-02-27 MED ORDER — AZITHROMYCIN 250 MG PO TABS: ORAL_TABLET | ORAL | 0 refills | Status: DC

## 2018-02-27 NOTE — Telephone Encounter (Signed)
Pt returned call, nt was not available.  Please callback

## 2018-02-27 NOTE — Telephone Encounter (Signed)
Patient called to discuss her symptoms of bronchitis. She says "I get this about once or twice a year and I was hoping Dr. Sarajane Jews could call me in something to ward this off, since I am going out of town on Friday to [place] for 8 days. Anyway, I don't have a fever, body aches or anything indicating the flu. I do have this cough that started 5 days ago and it's concerning because I have a heavy feeling in my chest when I take a deep breath. It's not severe, just mild and I know my body and this is going to get worse as the time goes on. I'm not coughing up anything, it's a dry cough. I've taken OTC cough syrup at night to help me with sleeping through the night. The cough comes and goes during the day. I have some prescription cough medicine from a year ago and haven't used that. If he can call me in something to take, I would really appreciate it. I am not able to come in for an appointment." According to protocol, home care advice given, she verbalized understanding. I advised this would be sent to Dr. Sarajane Jews for review and his recommendation, she verbalized understanding.  Reason for Disposition . Cough  Answer Assessment - Initial Assessment Questions 1. ONSET: "When did the cough begin?"      5 days ago 2. SEVERITY: "How bad is the cough today?"      I'm ok now, it just comes and goes 3. RESPIRATORY DISTRESS: "Describe your breathing."      Fine, not too bad. Can feel it in chest when take a deep breath 4. FEVER: "Do you have a fever?" If so, ask: "What is your temperature, how was it measured, and when did it start?"    No 5. HEMOPTYSIS: "Are you coughing up any blood?" If so ask: "How much?" (flecks, streaks, tablespoons, etc.)     No 6. TREATMENT: "What have you done so far to treat the cough?" (e.g., meds, fluids, humidifier)     Humidifier, OTC cough medicine, fluids 7. CARDIAC HISTORY: "Do you have any history of heart disease?" (e.g., heart attack, congestive heart failure)      No 8. LUNG  HISTORY: "Do you have any history of lung disease?"  (e.g., pulmonary embolus, asthma, emphysema)     No 9. PE RISK FACTORS: "Do you have a history of blood clots?" (or: recent major surgery, recent prolonged travel, bedridden )     No 10. OTHER SYMPTOMS: "Do you have any other symptoms? (e.g., runny nose, wheezing, chest pain)       Heavy chest when take a deep breath 11. PREGNANCY: "Is there any chance you are pregnant?" "When was your last menstrual period?"       No 12. TRAVEL: "Have you traveled out of the country in the last month?" (e.g., travel history, exposures)       No  Protocols used: COUGH - ACUTE NON-PRODUCTIVE-A-AH

## 2018-02-27 NOTE — Telephone Encounter (Signed)
Sent to PCP to advise 

## 2018-02-27 NOTE — Telephone Encounter (Signed)
Left message on home and cell phone for pt to return call to the office to triage current symptoms.

## 2018-02-27 NOTE — Telephone Encounter (Signed)
Call in a Zpack  ?

## 2018-02-27 NOTE — Telephone Encounter (Signed)
Copied from [place] [id number]. Topic: Quick Communication - See Telephone Encounter >> Feb 27, 2018 11:55 AM Boyd Kerbs wrote: CRM for notification. See Telephone encounter for:   Pt. Called saying she comes in every yr and has bronchitis.  She saying her symptoms are the same, with chest congestion and cough etc.  and is leaving on Friday.  With work she can not come in.  Asking if could call in antibiotic,  she has cough syrup from last year.   [facility] [phone] - [place], [place] - [place] [address] [place] [address] Phone: [phone] Fax: [phone]    02/27/18.

## 2018-02-27 NOTE — Telephone Encounter (Signed)
See triage encounter.

## 2018-02-27 NOTE — Telephone Encounter (Signed)
Called and spoke to pt's husband. Husband advised and voiced understanding.

## 2018-11-06 ENCOUNTER — Ambulatory Visit: Payer: Self-pay

## 2018-11-06 NOTE — Telephone Encounter (Signed)
Patient called in with c/o "cough." She says "I get this about 2 or 3 times a year and I have to be put on a z-pack to clear it up. I have company here for the holidays, so I will not be able to come in for an appointment. If Dr. Sarajane Jews will call me in something that will be great. If he says he can't and just continue what I'm doing, then I will call back and schedule an appointment when I check my work schedule." I asked about the cough, she says "it started about 2 weeks ago, as a nasal discharge. I can cough every 10-15 minutes with a series of coughing to the point I almost throw up, then I'm fine. I'm coughing up just a little bit of clear with light yellow, but nothing horrible like I've done in the past. My breathing is fine, no SOB. I have no fever. I have been using Tessalon Pearles, OTC cough medicine." I asked about other symptoms, she says "no wheezing, no chest pain, just a little runny nose." According to protocol, see PCP within 3 days. Patient says she will call back to schedule appointment, if she has to. I advised I will send this to Dr. Sarajane Jews for his review and someone will call with his recommendation.   Reason for Disposition . [1] Nasal discharge AND [2] present > 10 days  Answer Assessment - Initial Assessment Questions 1. ONSET: "When did the cough begin?"      2 weeks ago 2. SEVERITY: "How bad is the cough today?"      Right now it's ok, but gets worse as the day progresses. Cough every 10-15 minutes with a series of coughing. 3. RESPIRATORY DISTRESS: "Describe your breathing."      Breathing is fine 4. FEVER: "Do you have a fever?" If so, ask: "What is your temperature, how was it measured, and when did it start?"     No 5. HEMOPTYSIS: "Are you coughing up any blood?" If so ask: "How much?" (flecks, streaks, tablespoons, etc.)     No 6. TREATMENT: "What have you done so far to treat the cough?" (e.g., meds, fluids, humidifier)     Tessalon Pearles, cough and cold OTC  medicine 7. CARDIAC HISTORY: "Do you have any history of heart disease?" (e.g., heart attack, congestive heart failure)      No 8. LUNG HISTORY: "Do you have any history of lung disease?"  (e.g., pulmonary embolus, asthma, emphysema)     No 9. PE RISK FACTORS: "Do you have a history of blood clots?" (or: recent major surgery, recent prolonged travel, bedridden)     No 10. OTHER SYMPTOMS: "Do you have any other symptoms? (e.g., runny nose, wheezing, chest pain)       Little runny nose, no wheezing, no chest pain 11. PREGNANCY: "Is there any chance you are pregnant?" "When was your last menstrual period?"       NO 12. TRAVEL: "Have you traveled out of the country in the last month?" (e.g., travel history, exposures)        No  Protocols used: COUGH - ACUTE NON-PRODUCTIVE-A-AH

## 2018-11-11 NOTE — Telephone Encounter (Signed)
Call in a Zpack  ?

## 2018-11-11 NOTE — Telephone Encounter (Signed)
Called and spoke with pt and she stated that she is much better now and does not need the zpak.  This was not called to the pharmacy.

## 2018-11-18 ENCOUNTER — Telehealth: Payer: Self-pay | Admitting: Family Medicine

## 2018-11-18 NOTE — Telephone Encounter (Signed)
°  Relation to pt: self Call back number: [phone] Pharmacy: [facility] [place], [address]  [address] [place] [address]  Phone: [phone] Fax: [phone]  Not a 24 hour pharmacy; exact hours not known.     Reason for call:  Patient wanted to note, she is fearful no one will follow up today. Informed patient PCP / nurse are in clinic and her message was received and a nurse will follow up.

## 2018-11-18 NOTE — Telephone Encounter (Signed)
Copied from [place] [phone]. Topic: Quick Communication - Rx Refill/Question >> Nov 18, 2018  7:42 AM Scherrie Gerlach wrote: Medication: zpak  Pt called 11/27 and the dr was going to call her in a zpak, but pt thought she was better.  Now she states her sx have returned and she needs this medication. See nurse triage note dated 11/27 [facility] [place], [place] - [address] AT [address] [phone] (Phone) [phone] (Fax)

## 2018-11-19 NOTE — Telephone Encounter (Signed)
Pt calling to Cancell request for a RX she went somewhere else and got the medicine

## 2018-11-19 NOTE — Telephone Encounter (Signed)
Noted  

## 2019-02-11 ENCOUNTER — Other Ambulatory Visit (HOSPITAL_COMMUNITY): Payer: Self-pay | Admitting: *Deleted

## 2019-02-12 ENCOUNTER — Ambulatory Visit (HOSPITAL_COMMUNITY)
Admission: RE | Admit: 2019-02-12 | Discharge: 2019-02-12 | Disposition: A | Discharge status: Home / Self Care | Payer: BC Managed Care – PPO | Source: Ambulatory Visit | Attending: Obstetrics and Gynecology | Admitting: Obstetrics and Gynecology

## 2019-02-12 ENCOUNTER — Encounter (HOSPITAL_COMMUNITY): Payer: Self-pay

## 2019-02-12 DIAGNOSIS — D509 Iron deficiency anemia, unspecified: Secondary | ICD-10-CM | POA: Diagnosis not present

## 2019-02-12 MED ORDER — SODIUM CHLORIDE 0.9 % IV SOLN
510.0000 mg | INTRAVENOUS | Status: DC
  Administered 2019-02-12: 510 mg via INTRAVENOUS
  Filled 2019-02-12: qty 510

## 2019-02-12 NOTE — Discharge Instructions (Signed)

## 2019-02-17 ENCOUNTER — Ambulatory Visit (HOSPITAL_COMMUNITY)
Admission: RE | Admit: 2019-02-17 | Discharge: 2019-02-17 | Disposition: A | Discharge status: Home / Self Care | Payer: BC Managed Care – PPO | Source: Ambulatory Visit | Attending: Obstetrics and Gynecology | Admitting: Obstetrics and Gynecology

## 2019-02-17 DIAGNOSIS — D509 Iron deficiency anemia, unspecified: Secondary | ICD-10-CM | POA: Diagnosis not present

## 2019-02-17 MED ORDER — FERUMOXYTOL INJECTION 510 MG/17 ML
510.0000 mg | INTRAVENOUS | Status: DC
  Administered 2019-02-17: 510 mg via INTRAVENOUS
  Filled 2019-02-17: qty 510

## 2019-03-27 ENCOUNTER — Other Ambulatory Visit: Payer: Self-pay

## 2019-03-27 ENCOUNTER — Encounter (HOSPITAL_COMMUNITY): Payer: Self-pay

## 2019-03-27 ENCOUNTER — Emergency Department (HOSPITAL_COMMUNITY)
Admission: EM | Admit: 2019-03-27 | Discharge: 2019-03-27 | Disposition: A | Discharge status: Home / Self Care | Payer: BC Managed Care – PPO | Attending: Emergency Medicine | Admitting: Emergency Medicine

## 2019-03-27 DIAGNOSIS — R1031 Right lower quadrant pain: Secondary | ICD-10-CM | POA: Insufficient documentation

## 2019-03-27 DIAGNOSIS — Z79899 Other long term (current) drug therapy: Secondary | ICD-10-CM | POA: Diagnosis not present

## 2019-03-27 DIAGNOSIS — R1030 Lower abdominal pain, unspecified: Secondary | ICD-10-CM

## 2019-03-27 LAB — CBC WITH DIFFERENTIAL/PLATELET
Abs Immature Granulocytes: 0.01 10*3/uL (ref 0.00–0.07)
Basophils Absolute: 0 10*3/uL (ref 0.0–0.1)
Basophils Relative: 1 %
Eosinophils Absolute: 0.2 10*3/uL (ref 0.0–0.5)
Eosinophils Relative: 4 %
HCT: 39 % (ref 36.0–46.0)
Hemoglobin: 12.1 g/dL (ref 12.0–15.0)
Immature Granulocytes: 0 %
Lymphocytes Relative: 28 %
Lymphs Abs: 1.2 10*3/uL (ref 0.7–4.0)
MCH: 26.8 pg (ref 26.0–34.0)
MCHC: 31 g/dL (ref 30.0–36.0)
MCV: 86.3 fL (ref 80.0–100.0)
Monocytes Absolute: 0.3 10*3/uL (ref 0.1–1.0)
Monocytes Relative: 7 %
Neutro Abs: 2.6 10*3/uL (ref 1.7–7.7)
Neutrophils Relative %: 60 %
Platelets: 223 10*3/uL (ref 150–400)
RBC: 4.52 MIL/uL (ref 3.87–5.11)
WBC: 4.2 10*3/uL (ref 4.0–10.5)
nRBC: 0 % (ref 0.0–0.2)

## 2019-03-27 LAB — COMPREHENSIVE METABOLIC PANEL
ALT: 11 U/L (ref 0–44)
AST: 15 U/L (ref 15–41)
Albumin: 4.2 g/dL (ref 3.5–5.0)
Alkaline Phosphatase: 48 U/L (ref 38–126)
Anion gap: 8 (ref 5–15)
BUN: 13 mg/dL (ref 6–20)
CO2: 25 mmol/L (ref 22–32)
Calcium: 9 mg/dL (ref 8.9–10.3)
Chloride: 105 mmol/L (ref 98–111)
Creatinine, Ser: 0.65 mg/dL (ref 0.44–1.00)
GFR calc Af Amer: >60 mL/min (ref >60)
GFR calc non Af Amer: >60 mL/min (ref >60)
Glucose, Bld: 110 mg/dL — ABNORMAL HIGH (ref 70–99)
Potassium: 3.5 mmol/L (ref 3.5–5.1)
Sodium: 138 mmol/L (ref 135–145)
Total Bilirubin: 0.4 mg/dL (ref 0.3–1.2)
Total Protein: 7.1 g/dL (ref 6.5–8.1)

## 2019-03-27 MED ORDER — KETOROLAC TROMETHAMINE 30 MG/ML IJ SOLN
30.0000 mg | Freq: Once | INTRAMUSCULAR | Status: AC
  Administered 2019-03-27: 30 mg via INTRAVENOUS
  Filled 2019-03-27: qty 1

## 2019-03-27 NOTE — Discharge Instructions (Signed)
Follow up with your specialist. If your abdominal pain worsens, you develop fevers, persistent vomiting or if your pain moves to the right lower quadrant return immediately to see your physician or come to the Emergency Department.   Tylenol and ibuprofen every 6 hrs for pain as needed.  Thank you

## 2019-03-27 NOTE — ED Triage Notes (Signed)
Pt reports has a uterine fibroid and has heavy menstrual periods.  Reports is supposed to have a hysterectomy but has been put off due to covid pandemic.  Reports started bleeeding Monday.  Pt had [place] yesterday.  Reports started having severe right  lower abd pain this morning.   Reports diarrhea yesterday, none today.

## 2019-03-27 NOTE — ED Provider Notes (Signed)
[facility] EMERGENCY DEPARTMENT Provider Note   CSN: [id number] Arrival date & time: 03/27/19  2130    History   Chief Complaint Chief Complaint  Patient presents with  . Abdominal Pain    HPI Christy Chang is a 43 y.o. female.     Patient has history of uterine fibroids is followed by gynecology in [place], plan for hysterectomy after CO VID is calm down presents with worsening lower abdominal pain initially in the right lower quadrant now across the entire lower pelvis and abdomen.  Similar to ovarian cyst history, has improved since arrival.  No fevers or chills, no vomiting.  No abdominal surgery history.  Patient had mild diarrhea nonbloody.  Patient is on medications to help with cramping and bleeding from  gynecology.     Past Medical History:  Diagnosis Date  . Allergy   . Anemia   . GERD (gastroesophageal reflux disease)   . UTI (lower urinary tract infection)     Patient Active Problem List   Diagnosis Date Noted  . ANEMIA-IRON DEFICIENCY 12/28/2010  . ANEMIA 12/28/2010  . PHARYNGITIS, STREPTOCOCCAL, ACUTE 12/23/2009  . ACUTE BRONCHITIS 04/26/2009  . ACUTE SINUSITIS, UNSPECIFIED 12/10/2008  . DERMATOFIBROMA 09/08/2008  . OTHER ACUTE SINUSITIS 12/03/2007  . GERD 09/17/2007    Past Surgical History:  Procedure Laterality Date  . FOOT SURGERY       OB History   No obstetric history on file.      Home Medications    Prior to Admission medications   Medication Sig Start Date End Date Taking? Authorizing Provider  acetaminophen (TYLENOL) 325 MG tablet Take 650 mg by mouth every 6 (six) hours as needed for mild pain.   Yes [provider]  ferrous sulfate (SLOW RELEASE IRON) 160 (50 Fe) MG TBCR SR tablet Take 160 mg by mouth daily.    Yes [provider]  megestrol (MEGACE) 20 MG tablet Take 20 mg by mouth daily.    Yes [provider]  azithromycin (ZITHROMAX) 250 MG tablet Take as directed Patient not taking: Reported  on 03/27/2019 02/27/18   Laurey Morale, MD  cyclobenzaprine (FLEXERIL) 10 MG tablet Take 1 tablet (10 mg total) by mouth 3 (three) times daily as needed for muscle spasms. Patient not taking: Reported on 03/27/2019 10/09/17   Laurey Morale, MD  diazepam (VALIUM) 5 MG tablet Take 1-2 tablets one hour before procedure. Patient not taking: Reported on 03/27/2019 10/19/14   Laurey Morale, MD  methylPREDNISolone (MEDROL DOSEPAK) 4 MG TBPK tablet As directed Patient not taking: Reported on 03/27/2019 10/09/17   Laurey Morale, MD  Multiple Vitamin (MULTIVITAMIN) tablet Take 1 tablet by mouth daily. Reported on 01/25/2016    [provider]    Family History Family History  Problem Relation Age of Onset  . Heart disease Mother   . Hypertension Father   . Multiple sclerosis Other   . Cancer Other        colon 1st degree relative <60  . Hypertension Other   . Kidney disease Other   . Lung cancer Other   . Ulcers Other     Social History Social History   Tobacco Use  . Smoking status: Never Smoker  . Smokeless tobacco: Never Used  Substance Use Topics  . Alcohol use: Yes    Alcohol/week: 1.0 standard drinks    Types: 1 Standard drinks or equivalent per week    Comment: occ  . Drug use: No  Allergies   Sulfa antibiotics   Review of Systems Review of Systems  Constitutional: Negative for chills and fever.  HENT: Negative for congestion.   Eyes: Negative for visual disturbance.  Respiratory: Negative for shortness of breath.   Cardiovascular: Negative for chest pain.  Gastrointestinal: Positive for abdominal pain and diarrhea. Negative for vomiting.  Genitourinary: Positive for vaginal bleeding. Negative for dysuria and flank pain.  Musculoskeletal: Negative for back pain, neck pain and neck stiffness.  Skin: Negative for rash.  Neurological: Negative for light-headedness and headaches.     Physical Exam Updated Vital Signs BP 122/78   Pulse 78   Temp 98.3 F  (36.8 C)   Resp 18   Ht 5\' 5"  (1.651 m)   Wt 90.7 kg   LMP 03/24/2019   SpO2 99%   BMI 33.28 kg/m   Physical Exam Vitals signs and nursing note reviewed.  Constitutional:      Appearance: She is well-developed.  HENT:     Head: Normocephalic and atraumatic.  Eyes:     General:        Right eye: No discharge.        Left eye: No discharge.     Conjunctiva/sclera: Conjunctivae normal.  Neck:     Musculoskeletal: Normal range of motion and neck supple.     Trachea: No tracheal deviation.  Cardiovascular:     Rate and Rhythm: Normal rate and regular rhythm.  Pulmonary:     Effort: Pulmonary effort is normal.     Breath sounds: Normal breath sounds.  Abdominal:     General: There is no distension.     Palpations: Abdomen is soft.     Tenderness: There is abdominal tenderness (mild lower abd worse central pelvis). There is no guarding.  Skin:    General: Skin is warm.     Findings: No rash.  Neurological:     Mental Status: She is alert and oriented to person, place, and time.      ED Treatments / Results  Labs (all labs ordered are listed, but only abnormal results are displayed) Labs Reviewed  COMPREHENSIVE METABOLIC PANEL - Abnormal; Notable for the following components:      Result Value   Glucose, Bld 110 (*)    All other components within normal limits  CBC WITH DIFFERENTIAL/PLATELET    EKG None  Radiology No results found.  Procedures Procedures (including critical care time)  Medications Ordered in ED Medications  ketorolac (TORADOL) 30 MG/ML injection 30 mg (30 mg Intravenous Given 03/27/19 1133)     Initial Impression / Assessment and Plan / ED Course  I have reviewed the triage vital signs and the nursing notes.  Pertinent labs & imaging results that were available during my care of the patient were reviewed by me and considered in my medical decision making (see chart for details).       Well-appearing patient presents with worsening  abdominal and pelvic pain lower.  Fortunately without treatment patient's pain is improved significantly.  Plan for observation and reassessment/recheck of abdominal exam to determine need for CT scan.  No fever, normal white blood cell count, patient improving.  Unlikely appendicitis at this time and more likely related to fibroids/cyst.  Blood work reviewed within normal limits, normal hemoglobin, normal white blood cell count.  Recheck  Pt improved mild pain.   Disucussed CT abdo for further details of her symptoms, pt prefers to hold off at this time and follow up with GYN.  Pain medicine given in ED.   Final Clinical Impressions(s) / ED Diagnoses   Final diagnoses:  Lower abdominal pain    ED Discharge Orders    None       Elnora Morrison, MD 04/01/19 [phone]

## 2019-05-07 ENCOUNTER — Other Ambulatory Visit: Payer: Self-pay | Admitting: Obstetrics and Gynecology

## 2020-02-15 ENCOUNTER — Ambulatory Visit: Payer: BC Managed Care – PPO | Attending: Internal Medicine

## 2020-02-15 DIAGNOSIS — Z23 Encounter for immunization: Secondary | ICD-10-CM

## 2020-02-15 NOTE — Progress Notes (Signed)
   Covid-19 Vaccination Clinic  Name:  Christy Chang    MRN: [id number] DOB: [birth year]  02/15/2020  Christy Chang was observed post Covid-19 immunization for 15 minutes without incident. She was provided with Vaccine Information Sheet and instruction to access the V-Safe system.   Christy Chang was instructed to call 911 with any severe reactions post vaccine: Marland Kitchen Difficulty breathing  . Swelling of face and throat  . A fast heartbeat  . A bad rash all over body  . Dizziness and weakness   Immunizations Administered    Name Date Dose VIS Date Route   Pfizer COVID-19 Vaccine 02/15/2020  8:41 AM 0.3 mL 11/21/2019 Intramuscular   Manufacturer: [place]   Lot: [id number]   [place]: [id number]

## 2020-03-07 ENCOUNTER — Ambulatory Visit: Payer: BC Managed Care – PPO | Attending: Internal Medicine

## 2020-03-07 DIAGNOSIS — Z23 Encounter for immunization: Secondary | ICD-10-CM

## 2020-03-07 NOTE — Progress Notes (Signed)
   Covid-19 Vaccination Clinic  Name:  Christy Chang    MRN: [id number] DOB: 09-08-1976  03/07/2020  [name] was observed post Covid-19 immunization for 15 minutes without incident. She was provided with Vaccine Information Sheet and instruction to access the V-Safe system.   [name] was instructed to call 911 with any severe reactions post vaccine: Marland Kitchen Difficulty breathing  . Swelling of face and throat  . A fast heartbeat  . A bad rash all over body  . Dizziness and weakness   Immunizations Administered    Name Date Dose VIS Date Route   Pfizer COVID-19 Vaccine 03/07/2020  9:05 AM 0.3 mL 11/21/2019 Intramuscular   Manufacturer: [place]   Lot: [id number]   [place]: [id number]

## 2021-09-01 ENCOUNTER — Ambulatory Visit: Payer: BC Managed Care – PPO | Admitting: Family Medicine

## 2021-09-01 ENCOUNTER — Other Ambulatory Visit: Payer: Self-pay

## 2021-09-01 ENCOUNTER — Encounter: Payer: Self-pay | Admitting: Family Medicine

## 2021-09-01 VITALS — BP 146/88 | HR 82 | Temp 98.9°F | Wt 202.0 lb

## 2021-09-01 DIAGNOSIS — I10 Essential (primary) hypertension: Secondary | ICD-10-CM | POA: Diagnosis not present

## 2021-09-01 DIAGNOSIS — R519 Headache, unspecified: Secondary | ICD-10-CM

## 2021-09-01 LAB — CBC WITH DIFFERENTIAL/PLATELET
Basophils Absolute: 0.1 10*3/uL (ref 0.0–0.1)
Basophils Relative: 1.1 % (ref 0.0–3.0)
Eosinophils Absolute: 0.3 10*3/uL (ref 0.0–0.7)
Eosinophils Relative: 5.5 % — ABNORMAL HIGH (ref 0.0–5.0)
HCT: 40.6 % (ref 36.0–46.0)
Hemoglobin: 13.3 g/dL (ref 12.0–15.0)
Lymphocytes Relative: 33.3 % (ref 12.0–46.0)
Lymphs Abs: 1.6 10*3/uL (ref 0.7–4.0)
MCHC: 32.8 g/dL (ref 30.0–36.0)
MCV: 83.7 fl (ref 78.0–100.0)
Monocytes Absolute: 0.5 10*3/uL (ref 0.1–1.0)
Monocytes Relative: 11 % (ref 3.0–12.0)
Neutro Abs: 2.4 10*3/uL (ref 1.4–7.7)
Neutrophils Relative %: 49.1 % (ref 43.0–77.0)
Platelets: 302 10*3/uL (ref 150.0–400.0)
RBC: 4.85 Mil/uL (ref 3.87–5.11)
RDW: 17.1 % — ABNORMAL HIGH (ref 11.5–15.5)
WBC: 4.9 10*3/uL (ref 4.0–10.5)

## 2021-09-01 LAB — TSH: TSH: 1.6 u[IU]/mL (ref 0.35–5.50)

## 2021-09-01 LAB — BASIC METABOLIC PANEL
BUN: 16 mg/dL (ref 6–23)
CO2: 27 mEq/L (ref 19–32)
Calcium: 9.3 mg/dL (ref 8.4–10.5)
Chloride: 104 mEq/L (ref 96–112)
Creatinine, Ser: 0.75 mg/dL (ref 0.40–1.20)
GFR: 96.34 mL/min (ref >60.00)
Glucose, Bld: 84 mg/dL (ref 70–99)
Potassium: 4 mEq/L (ref 3.5–5.1)
Sodium: 138 mEq/L (ref 135–145)

## 2021-09-01 LAB — HEPATIC FUNCTION PANEL
ALT: 11 U/L (ref 0–35)
AST: 16 U/L (ref 0–37)
Albumin: 4.4 g/dL (ref 3.5–5.2)
Alkaline Phosphatase: 51 U/L (ref 39–117)
Bilirubin, Direct: 0.1 mg/dL (ref 0.0–0.3)
Total Bilirubin: 0.4 mg/dL (ref 0.2–1.2)
Total Protein: 7.4 g/dL (ref 6.0–8.3)

## 2021-09-01 MED ORDER — LISINOPRIL 10 MG PO TABS: 10.0000 mg | ORAL_TABLET | Freq: Every day | ORAL | 3 refills | Status: DC

## 2021-09-01 NOTE — Progress Notes (Signed)
   Subjective:    Patient ID: Christy Chang, female    DOB: Jan 10, 1976, 45 y.o.   MRN: [id number]  HPI Here for 6 days of dull generalized headaches and elevated BP. She has never had high BP readings before, but she has a strong family hx of HTN including her mother. Her BP at home has been running in the 140s to150s over 90s. No fever or ST or cough. No nausea or light sensitivity. She does not smoke.    Review of Systems  Constitutional: Negative.   Respiratory: Negative.    Cardiovascular: Negative.   Neurological:  Positive for headaches.      Objective:   Physical Exam Constitutional:      Appearance: Normal appearance.  Cardiovascular:     Rate and Rhythm: Normal rate and regular rhythm.     Pulses: Normal pulses.     Heart sounds: Normal heart sounds.  Pulmonary:     Effort: Pulmonary effort is normal.     Breath sounds: Normal breath sounds.  Musculoskeletal:     Right lower leg: No edema.     Left lower leg: No edema.  Lymphadenopathy:     Cervical: No cervical adenopathy.  Neurological:     General: No focal deficit present.     Mental Status: She is alert and oriented to person, place, and time.          Assessment & Plan:  New onset HTN. This is what is causing the headaches I believe. She will start taking  Lisinopril 10 mg daily. We discussed getting exercise and reducing her sodium intake. Get labs today. Recheck in 3-4 weeks.  Alysia Penna, MD

## 2021-09-29 ENCOUNTER — Other Ambulatory Visit: Payer: Self-pay

## 2021-09-29 ENCOUNTER — Ambulatory Visit: Payer: BC Managed Care – PPO | Admitting: Family Medicine

## 2021-09-29 ENCOUNTER — Encounter: Payer: Self-pay | Admitting: Family Medicine

## 2021-09-29 VITALS — BP 120/82 | HR 66 | Temp 98.6°F | Wt 200.4 lb

## 2021-09-29 DIAGNOSIS — E785 Hyperlipidemia, unspecified: Secondary | ICD-10-CM

## 2021-09-29 DIAGNOSIS — I10 Essential (primary) hypertension: Secondary | ICD-10-CM | POA: Diagnosis not present

## 2021-09-29 LAB — LIPID PANEL
Cholesterol: 155 mg/dL (ref 0–200)
HDL: 64 mg/dL (ref >39.00)
LDL Cholesterol: 78 mg/dL (ref 0–99)
NonHDL: 91.34
Total CHOL/HDL Ratio: 2
Triglycerides: 69 mg/dL (ref 0.0–149.0)
VLDL: 13.8 mg/dL (ref 0.0–40.0)

## 2021-09-29 NOTE — Addendum Note (Signed)
Addended by: Amanda Cockayne on: 09/29/2021 08:49 AM   Modules accepted: Orders

## 2021-09-29 NOTE — Progress Notes (Signed)
   Subjective:    Patient ID: Christy Chang, female    DOB: 11/18/1976, 45 y.o.   MRN: [id number]  HPI Here to follow up on HTN. She has been taking Lisinopril, and her BP has come down nicely. She does mention a tickling cough that started a few weeks ago. She is not sure if this is due to the medication or to allergies.    Review of Systems  Constitutional: Negative.   Respiratory:  Positive for cough. Negative for shortness of breath and wheezing.   Cardiovascular: Negative.       Objective:   Physical Exam Constitutional:      Appearance: Normal appearance.  Cardiovascular:     Rate and Rhythm: Normal rate and regular rhythm.     Pulses: Normal pulses.     Heart sounds: Normal heart sounds.  Pulmonary:     Effort: Pulmonary effort is normal.     Breath sounds: Normal breath sounds.  Neurological:     Mental Status: She is alert.          Assessment & Plan:  HTN, now controlled. We will follow up about the cough. If it does not go away, we may consider switching to another BP med.  Alysia Penna, MD

## 2021-10-05 ENCOUNTER — Telehealth: Payer: Self-pay

## 2021-10-05 MED ORDER — LOSARTAN POTASSIUM 50 MG PO TABS: 50.0000 mg | ORAL_TABLET | Freq: Every day | ORAL | 3 refills | Status: DC

## 2021-10-05 NOTE — Telephone Encounter (Signed)
Patient called back and was informed of message verbalized understanding

## 2021-10-05 NOTE — Telephone Encounter (Signed)
Tried to call pt with Dr Sarajane Jews advise, will try call back

## 2021-10-05 NOTE — Telephone Encounter (Signed)
Patient called stating the cough hasn't gotten any better and would another Rx sent to the pharmacy

## 2021-10-05 NOTE — Telephone Encounter (Signed)
Pt LOV was 09/29/21, pt state that she mentioned about the cough on her last visit, Please advise

## 2021-10-05 NOTE — Telephone Encounter (Signed)
We will stop the Lisinopril. I will send in Losartan 50 mg to take daily. The cough should fade away over the next 2 weeks

## 2021-10-05 NOTE — Addendum Note (Signed)
Addended by: Alysia Penna A on: 10/05/2021 01:53 PM   Modules accepted: Orders

## 2022-01-03 ENCOUNTER — Other Ambulatory Visit: Payer: Self-pay | Admitting: Family Medicine

## 2022-06-09 ENCOUNTER — Other Ambulatory Visit: Payer: Self-pay | Admitting: Family Medicine

## 2022-06-09 DIAGNOSIS — I10 Essential (primary) hypertension: Secondary | ICD-10-CM

## 2022-09-08 ENCOUNTER — Ambulatory Visit: Payer: BC Managed Care – PPO | Admitting: Family Medicine

## 2022-09-08 VITALS — BP 130/82 | HR 102 | Temp 98.2°F | Ht 65.0 in | Wt 217.0 lb

## 2022-09-08 DIAGNOSIS — R053 Chronic cough: Secondary | ICD-10-CM

## 2022-09-08 DIAGNOSIS — U099 Post covid-19 condition, unspecified: Secondary | ICD-10-CM

## 2022-09-08 MED ORDER — ALBUTEROL SULFATE HFA 108 (90 BASE) MCG/ACT IN AERS: 2.0000 | INHALATION_SPRAY | RESPIRATORY_TRACT | 2 refills | Status: DC | PRN

## 2022-09-08 MED ORDER — BENZONATATE 200 MG PO CAPS: 200.0000 mg | ORAL_CAPSULE | Freq: Four times a day (QID) | ORAL | 1 refills | Status: DC | PRN

## 2022-09-08 NOTE — Progress Notes (Signed)
   Subjective:    Patient ID: Christy Chang, female    DOB: 09-Oct-1976, 46 y.o.   MRN: [id number]  HPI Here for a cough that persists after having a Covid-19 infection. On 07-24-22 she tested positive for the Covid virus at home. She was having body aches, fatigue, and a dry cough. No SOB or chest pain. No fever. She never saw a health provider for this, and most of these symptoms resolved within a week. However the cough and wheezing have stayed with her. She is drinking fluids but she has not taken anything for this. She notes that she was told she had "borderline asthma" as a child.    Review of Systems  Constitutional: Negative.   HENT: Negative.    Eyes: Negative.   Respiratory:  Positive for cough and wheezing. Negative for shortness of breath.   Cardiovascular: Negative.   Gastrointestinal: Negative.        Objective:   Physical Exam Constitutional:      Appearance: Normal appearance.     Comments: Coughing occasionally   Cardiovascular:     Rate and Rhythm: Normal rate and regular rhythm.     Pulses: Normal pulses.     Heart sounds: Normal heart sounds.  Pulmonary:     Effort: Pulmonary effort is normal. No respiratory distress.     Breath sounds: Normal breath sounds. No stridor. No wheezing, rhonchi or rales.  Lymphadenopathy:     Cervical: No cervical adenopathy.  Neurological:     Mental Status: She is alert.           Assessment & Plan:  She has post-Covid cough, mostly from bronchospasm. She will use an albuterol inhaler every 4 hours and Benzonatate capsules every 6 hours. If she is not better by next week, we will call in a Prednisone taper for her.  Alysia Penna, MD

## 2022-09-12 ENCOUNTER — Other Ambulatory Visit: Payer: Self-pay | Admitting: Family Medicine

## 2022-09-12 ENCOUNTER — Telehealth: Payer: Self-pay | Admitting: Family Medicine

## 2022-09-12 DIAGNOSIS — I10 Essential (primary) hypertension: Secondary | ICD-10-CM

## 2022-09-12 NOTE — Telephone Encounter (Signed)
Pt is calling back and she still have the cough and would like to proceed with getting steroid please send to  [facility] [phone] - [place], [address] AT [address] Phone:  [phone]  Fax:  [phone]    Pt was seen on 09-08-2022

## 2022-09-13 MED ORDER — METHYLPREDNISOLONE 4 MG PO TBPK: ORAL_TABLET | ORAL | 0 refills | Status: DC

## 2022-09-13 NOTE — Telephone Encounter (Signed)
Lvm for patient that prescription has been sent to the pharmacy.

## 2022-09-13 NOTE — Telephone Encounter (Signed)
Done

## 2022-09-13 NOTE — Telephone Encounter (Signed)
See 09/08/22 OV.  Pharmacy updated.

## 2022-12-16 ENCOUNTER — Other Ambulatory Visit: Payer: Self-pay | Admitting: Family Medicine

## 2022-12-16 DIAGNOSIS — I10 Essential (primary) hypertension: Secondary | ICD-10-CM

## 2023-01-12 ENCOUNTER — Ambulatory Visit
Admission: RE | Admit: 2023-01-12 | Discharge: 2023-01-12 | Disposition: A | Discharge status: Home / Self Care | Payer: BC Managed Care – PPO | Source: Ambulatory Visit | Attending: Nurse Practitioner | Admitting: Nurse Practitioner

## 2023-01-12 ENCOUNTER — Other Ambulatory Visit: Payer: Self-pay

## 2023-01-12 VITALS — BP 138/81 | HR 73 | Temp 98.5°F | Resp 20

## 2023-01-12 DIAGNOSIS — R1013 Epigastric pain: Secondary | ICD-10-CM

## 2023-01-12 LAB — POCT URINALYSIS DIP (MANUAL ENTRY)
Glucose, UA: NEGATIVE mg/dL
Leukocytes, UA: NEGATIVE
Nitrite, UA: NEGATIVE
Protein Ur, POC: 30 mg/dL — AB
Spec Grav, UA: >=1.03 — AB (ref 1.010–1.025)
Urobilinogen, UA: 0.2 E.U./dL
pH, UA: 6.5 (ref 5.0–8.0)

## 2023-01-12 MED ORDER — ONDANSETRON 4 MG PO TBDP
4.0000 mg | ORAL_TABLET | Freq: Once | ORAL | Status: AC
  Administered 2023-01-12: 4 mg via ORAL

## 2023-01-12 MED ORDER — ONDANSETRON 4 MG PO TBDP: 4.0000 mg | ORAL_TABLET | Freq: Three times a day (TID) | ORAL | 0 refills | Status: DC | PRN

## 2023-01-12 NOTE — ED Triage Notes (Signed)
Pt reports epigastric pain that radiates to LUQ at times. Intermittent lower back pain, nausea, emesis since yesterday. Denies any fever, alterations in urination and BM.

## 2023-01-12 NOTE — ED Provider Notes (Signed)
[facility]    CSN: [id number] Arrival date & time: 01/12/23  1126      History   Chief Complaint Chief Complaint  Patient presents with   Nausea    Starred feeling sick yesterday, vomiting on and off through night, now persistent stomach pain and nausea - Entered by patient    HPI Christy Chang is a 47 y.o. female.   Patient presents today for 1 day history of epigastric abdominal pain that radiates to the left upper quadrant.  Reports the pain is constant and moderate to severe.  The pain is sharp.  No radiation of pain to her back, other side of abdomen, or leg.  Patient reports yesterday, she started with right-sided kidney pain, however this is now resolved.  Patient denies fever, body aches, chills, diarrhea or constipation, blood in the stool, heartburn or indigestion, new rash, dysuria/urinary frequency or urgency, hematuria.  The patient has been nauseous and has vomited multiple times, however she attributes this to the pain.  Has not been able to eat much because of the pain.  Has not tried anything for the pain so far.  Patient reports is difficult to get comfortable sitting or standing.       Past Medical History:  Diagnosis Date   Allergy    Anemia    GERD (gastroesophageal reflux disease)    UTI (lower urinary tract infection)     Patient Active Problem List   Diagnosis Date Noted   Dyslipidemia 09/29/2021   Primary hypertension 09/29/2021   ANEMIA-IRON DEFICIENCY 12/28/2010   ANEMIA 12/28/2010   PHARYNGITIS, STREPTOCOCCAL, ACUTE 12/23/2009   ACUTE BRONCHITIS 04/26/2009   ACUTE SINUSITIS, UNSPECIFIED 12/10/2008   DERMATOFIBROMA 09/08/2008   OTHER ACUTE SINUSITIS 12/03/2007   GERD 09/17/2007    Past Surgical History:  Procedure Laterality Date   ABDOMINAL HYSTERECTOMY     FOOT SURGERY      OB History   No obstetric history on file.      Home Medications    Prior to Admission medications   Medication Sig Start Date End Date Taking?  Authorizing Provider  ondansetron (ZOFRAN-ODT) 4 MG disintegrating tablet Take 1 tablet (4 mg total) by mouth every 8 (eight) hours as needed for nausea or vomiting. 01/12/23  Yes Eulogio Bear, NP  acetaminophen (TYLENOL) 325 MG tablet Take 650 mg by mouth every 6 (six) hours as needed for mild pain.    [provider]  albuterol (VENTOLIN HFA) 108 (90 Base) MCG/ACT inhaler Inhale 2 puffs into the lungs every 4 (four) hours as needed for wheezing or shortness of breath. 09/08/22   Laurey Morale, MD  benzonatate (TESSALON) 200 MG capsule Take 1 capsule (200 mg total) by mouth every 6 (six) hours as needed for cough. 09/08/22   Laurey Morale, MD  diazepam (VALIUM) 5 MG tablet Take 1-2 tablets one hour before procedure. 10/19/14   Laurey Morale, MD  losartan (COZAAR) 50 MG tablet TAKE 1 TABLET(50 MG) BY MOUTH DAILY 12/18/22   Laurey Morale, MD  methylPREDNISolone (MEDROL DOSEPAK) 4 MG TBPK tablet As directed 09/13/22   Laurey Morale, MD  Multiple Vitamin (MULTIVITAMIN) tablet Take 1 tablet by mouth daily. Reported on 01/25/2016    [provider]    Family History Family History  Problem Relation Age of Onset   Heart disease Mother    Hypertension Father    Multiple sclerosis Other    Cancer Other  colon 1st degree relative <60   Hypertension Other    Kidney disease Other    Lung cancer Other    Ulcers Other     Social History Social History   Tobacco Use   Smoking status: Never   Smokeless tobacco: Never  Substance Use Topics   Alcohol use: Yes    Alcohol/week: 1.0 standard drink of alcohol    Types: 1 Standard drinks or equivalent per week    Comment: occ   Drug use: No     Allergies   Lisinopril and Sulfa antibiotics   Review of Systems Review of Systems Per HPI  Physical Exam Triage Vital Signs ED Triage Vitals  Enc Vitals Group     BP 01/12/23 1213 138/81     Pulse Rate 01/12/23 1213 73     Resp 01/12/23 1213 20     Temp 01/12/23  1213 98.5 F (36.9 C)     Temp Source 01/12/23 1213 Oral     SpO2 01/12/23 1213 98 %     Weight --      Height --      Head Circumference --      Peak Flow --      Pain Score 01/12/23 1211 6     Pain Loc --      Pain Edu? --      Excl. in [place]? --    No data found.  Updated Vital Signs BP 138/81 (BP Location: Right Arm)   Pulse 73   Temp 98.5 F (36.9 C) (Oral)   Resp 20   LMP 03/24/2019   SpO2 98%   Visual Acuity Right Eye Distance:   Left Eye Distance:   Bilateral Distance:    Right Eye Near:   Left Eye Near:    Bilateral Near:     Physical Exam Vitals and nursing note reviewed.  Constitutional:      General: She is not in acute distress.    Appearance: Normal appearance. She is not ill-appearing or toxic-appearing.     Comments: Uncomfortable appearing-shifting around while sitting on examination table  HENT:     Head: Normocephalic and atraumatic.     Mouth/Throat:     Mouth: Mucous membranes are moist.     Pharynx: Oropharynx is clear.  Eyes:     General: No scleral icterus.    Extraocular Movements: Extraocular movements intact.  Cardiovascular:     Rate and Rhythm: Normal rate and regular rhythm.  Pulmonary:     Effort: Pulmonary effort is normal. No respiratory distress.     Breath sounds: Normal breath sounds. No wheezing, rhonchi or rales.  Abdominal:     General: Abdomen is flat. Bowel sounds are normal. There is no distension.     Palpations: Abdomen is soft.     Tenderness: There is abdominal tenderness. There is no right CVA tenderness, left CVA tenderness or guarding. Negative signs include Murphy's sign and McBurney's sign.       Comments: Pain to area marked  Musculoskeletal:     Cervical back: Normal range of motion.  Lymphadenopathy:     Cervical: No cervical adenopathy.  Skin:    General: Skin is warm and dry.     Capillary Refill: Capillary refill takes less than 2 seconds.     Coloration: Skin is not jaundiced or pale.      Findings: No erythema.  Neurological:     Mental Status: She is alert and oriented to person, place, and  time.  Psychiatric:        Mood and Affect: Mood is anxious.        Behavior: Behavior is cooperative.      UC Treatments / Results  Labs (all labs ordered are listed, but only abnormal results are displayed) Labs Reviewed  POCT URINALYSIS DIP (MANUAL ENTRY) - Abnormal; Notable for the following components:      Result Value   Bilirubin, UA small (*)    Ketones, POC UA small (15) (*)    Spec Grav, UA >=1.030 (*)    Blood, UA trace-intact (*)    Protein Ur, POC =30 (*)    All other components within normal limits  LIPASE  AMYLASE    EKG   Radiology No results found.  Procedures Procedures (including critical care time)  Medications Ordered in UC Medications  ondansetron (ZOFRAN-ODT) disintegrating tablet 4 mg (4 mg Oral Given 01/12/23 1238)    Initial Impression / Assessment and Plan / UC Course  I have reviewed the triage vital signs and the nursing notes.  Pertinent labs & imaging results that were available during my care of the patient were reviewed by me and considered in my medical decision making (see chart for details).   Patient is well-appearing, normotensive, afebrile, not tachycardic, not tachypneic, oxygenating well on room air.    1. Epigastric pain Urinalysis today suggests concentration-I suspect mild dehydration Discussed with patient I am unable to determine etiology of pain today; differentials include pancreatitis, GERD, viral gastroenteritis, kidney stone Zofran 4 mg ODT given today in urgent care which completely helped with nausea Amylase and lipase checked today to check for pancreatitis  Supportive care discussed including bowel rest, ice chips only Treat nausea with Zofran ODT 4 mg every 8 hours as needed Strict ER precautions discussed with patient  The patient was given the opportunity to ask questions.  All questions answered to  their satisfaction.  The patient is in agreement to this plan.    Final Clinical Impressions(s) / UC Diagnoses   Final diagnoses:  Epigastric pain     Discharge Instructions      I am unsure what is causing the pain in your abdomen.  My highest suspicion is that it may be pancreatitis.  We have checked some blood work today, if the levels come back elevated, this makes pancreatitis more likely.  Please refrain from eating or drinking a lot of fluids and food over the next couple of days.  You can take the Zofran under your tongue every 8 hours as you need to for nausea.  If the pain becomes unbearable or you begin vomiting and are unable to keep ice chips down, please go to the emergency room.     ED Prescriptions     Medication Sig Dispense Auth. Provider   ondansetron (ZOFRAN-ODT) 4 MG disintegrating tablet Take 1 tablet (4 mg total) by mouth every 8 (eight) hours as needed for nausea or vomiting. 20 tablet Eulogio Bear, NP      PDMP not reviewed this encounter.   Eulogio Bear, NP 01/12/23 [phone]

## 2023-01-12 NOTE — Discharge Instructions (Addendum)
I am unsure what is causing the pain in your abdomen.  My highest suspicion is that it may be pancreatitis.  We have checked some blood work today, if the levels come back elevated, this makes pancreatitis more likely.  Please refrain from eating or drinking a lot of fluids and food over the next couple of days.  You can take the Zofran under your tongue every 8 hours as you need to for nausea.  If the pain becomes unbearable or you begin vomiting and are unable to keep ice chips down, please go to the emergency room.

## 2023-01-13 LAB — AMYLASE: Amylase: 24 U/L — ABNORMAL LOW (ref 31–110)

## 2023-01-13 LAB — LIPASE: Lipase: 20 U/L (ref 14–72)

## 2023-01-17 ENCOUNTER — Telehealth (HOSPITAL_COMMUNITY): Payer: Self-pay | Admitting: Emergency Medicine

## 2023-01-17 NOTE — Telephone Encounter (Signed)
Patient left a voicemail requesting results review from recent visit.   Attempted to reach patient, LVM with general information, requested return call with questions

## 2023-01-29 ENCOUNTER — Encounter: Payer: Self-pay | Admitting: Family Medicine

## 2023-01-30 MED ORDER — DIAZEPAM 5 MG PO TABS: ORAL_TABLET | ORAL | 0 refills | Status: DC

## 2023-01-30 NOTE — Telephone Encounter (Signed)
Done

## 2023-05-01 ENCOUNTER — Other Ambulatory Visit: Payer: Self-pay | Admitting: Family Medicine

## 2023-05-01 DIAGNOSIS — I10 Essential (primary) hypertension: Secondary | ICD-10-CM

## 2023-05-08 ENCOUNTER — Encounter: Payer: Self-pay | Admitting: Family Medicine

## 2023-07-26 ENCOUNTER — Other Ambulatory Visit: Payer: Self-pay | Admitting: Family Medicine

## 2023-07-26 DIAGNOSIS — I10 Essential (primary) hypertension: Secondary | ICD-10-CM

## 2023-07-27 ENCOUNTER — Other Ambulatory Visit: Payer: Self-pay | Admitting: Family Medicine

## 2023-07-27 DIAGNOSIS — I10 Essential (primary) hypertension: Secondary | ICD-10-CM

## 2023-09-18 ENCOUNTER — Other Ambulatory Visit: Payer: Self-pay | Admitting: Family Medicine

## 2023-09-18 DIAGNOSIS — I10 Essential (primary) hypertension: Secondary | ICD-10-CM

## 2023-09-20 ENCOUNTER — Other Ambulatory Visit: Payer: Self-pay | Admitting: Family Medicine

## 2023-09-20 DIAGNOSIS — I10 Essential (primary) hypertension: Secondary | ICD-10-CM

## 2023-09-20 MED ORDER — LOSARTAN POTASSIUM 50 MG PO TABS: 50.0000 mg | ORAL_TABLET | Freq: Every day | ORAL | 0 refills | Status: DC

## 2023-09-20 NOTE — Telephone Encounter (Signed)
Pt called and she is very worried because she has been out of this Rx for several days now, and was told by the pharmacy that the refill was denied.  Pt is asking what can she do to get her refill?  Please advise.   [facility] [phone] - [place], [place] - [address] AT [facility] [address] Phone: [phone]  Fax: [phone]

## 2023-10-18 ENCOUNTER — Telehealth: Payer: Self-pay | Admitting: Family Medicine

## 2023-10-18 NOTE — Telephone Encounter (Signed)
Prescription Request  10/18/2023  LOV: Visit date not found  What is the name of the medication or equipment? losartan (COZAAR) 50 MG tablet  Have you contacted your pharmacy to request a refill? Yes   Which pharmacy would you like this sent to?  [facility] [phone] - [place], [place] - [address] AT [facility] OF [address] [place] [address] Phone: [phone] Fax: [phone]    Patient notified that their request is being sent to the clinical staff for review and that they should receive a response within 2 business days.   Please advise at Mobile [phone] (mobile)

## 2023-10-19 NOTE — Telephone Encounter (Signed)
Pt needs appointment. Left pt a message to call the office for appointment

## 2023-10-26 NOTE — Telephone Encounter (Signed)
Done

## 2023-10-26 NOTE — Telephone Encounter (Signed)
Spoke with pt advised to schedule appointment with Dr Clent Ridges before she runs out of Losartan refill. Pt verbalized understanding

## 2023-11-19 ENCOUNTER — Ambulatory Visit: Payer: BC Managed Care – PPO | Admitting: Family Medicine

## 2023-11-19 ENCOUNTER — Encounter: Payer: Self-pay | Admitting: Family Medicine

## 2023-11-19 VITALS — BP 118/76 | HR 96 | Temp 98.5°F | Wt 210.0 lb

## 2023-11-19 DIAGNOSIS — I10 Essential (primary) hypertension: Secondary | ICD-10-CM | POA: Diagnosis not present

## 2023-11-19 DIAGNOSIS — F419 Anxiety disorder, unspecified: Secondary | ICD-10-CM | POA: Diagnosis not present

## 2023-11-19 DIAGNOSIS — F32A Depression, unspecified: Secondary | ICD-10-CM | POA: Diagnosis not present

## 2023-11-19 DIAGNOSIS — Z0000 Encounter for general adult medical examination without abnormal findings: Secondary | ICD-10-CM

## 2023-11-19 MED ORDER — LOSARTAN POTASSIUM 50 MG PO TABS: 50.0000 mg | ORAL_TABLET | Freq: Every day | ORAL | 3 refills | Status: DC

## 2023-11-19 NOTE — Progress Notes (Signed)
   Subjective:    Patient ID: Christy Chang, female    DOB: March 27, 1976, 47 y.o.   MRN: [id number]  HPI Here for a well exam. She feels fine, and her BP has been well controlled. She has no concerns. Her GYN recently started her on Pristiq for anxiety and depression, and this is working well for her.    Review of Systems  Constitutional: Negative.   HENT: Negative.    Eyes: Negative.   Respiratory: Negative.    Cardiovascular: Negative.   Gastrointestinal: Negative.   Genitourinary:  Negative for decreased urine volume, difficulty urinating, dyspareunia, dysuria, enuresis, flank pain, frequency, hematuria, pelvic pain and urgency.  Musculoskeletal: Negative.   Skin: Negative.   Neurological: Negative.  Negative for headaches.  Psychiatric/Behavioral: Negative.         Objective:   Physical Exam Constitutional:      General: She is not in acute distress.    Appearance: She is well-developed. She is obese.  HENT:     Head: Normocephalic and atraumatic.     Right Ear: External ear normal.     Left Ear: External ear normal.     Nose: Nose normal.     Mouth/Throat:     Pharynx: No oropharyngeal exudate.  Eyes:     General: No scleral icterus.    Conjunctiva/sclera: Conjunctivae normal.     Pupils: Pupils are equal, round, and reactive to light.  Neck:     Thyroid: No thyromegaly.     Vascular: No JVD.  Cardiovascular:     Rate and Rhythm: Normal rate and regular rhythm.     Pulses: Normal pulses.     Heart sounds: Normal heart sounds. No murmur heard.    No friction rub. No gallop.  Pulmonary:     Effort: Pulmonary effort is normal. No respiratory distress.     Breath sounds: Normal breath sounds. No wheezing or rales.  Chest:     Chest wall: No tenderness.  Abdominal:     General: Bowel sounds are normal. There is no distension.     Palpations: Abdomen is soft. There is no mass.     Tenderness: There is no abdominal tenderness. There is no guarding or rebound.   Musculoskeletal:        General: No tenderness. Normal range of motion.     Cervical back: Normal range of motion and neck supple.  Lymphadenopathy:     Cervical: No cervical adenopathy.  Skin:    General: Skin is warm and dry.     Findings: No erythema or rash.  Neurological:     General: No focal deficit present.     Mental Status: She is alert and oriented to person, place, and time.     Cranial Nerves: No cranial nerve deficit.     Motor: No abnormal muscle tone.     Coordination: Coordination normal.     Deep Tendon Reflexes: Reflexes are normal and symmetric. Reflexes normal.  Psychiatric:        Mood and Affect: Mood normal.        Behavior: Behavior normal.        Thought Content: Thought content normal.        Judgment: Judgment normal.           Assessment & Plan:  Well exam. We discussed diet and exercise. Get fasting labs. Gershon Crane, MD

## 2023-11-26 ENCOUNTER — Other Ambulatory Visit: Payer: BC Managed Care – PPO

## 2024-07-16 ENCOUNTER — Telehealth: Admitting: Physician Assistant

## 2024-07-16 DIAGNOSIS — J208 Acute bronchitis due to other specified organisms: Secondary | ICD-10-CM

## 2024-07-16 MED ORDER — PREDNISONE 10 MG (21) PO TBPK: ORAL_TABLET | ORAL | 0 refills | Status: DC

## 2024-07-16 MED ORDER — BENZONATATE 100 MG PO CAPS: 100.0000 mg | ORAL_CAPSULE | Freq: Three times a day (TID) | ORAL | 0 refills | Status: DC | PRN

## 2024-07-16 MED ORDER — PROMETHAZINE-DM 6.25-15 MG/5ML PO SYRP: 5.0000 mL | ORAL_SOLUTION | Freq: Four times a day (QID) | ORAL | 0 refills | Status: DC | PRN

## 2024-07-16 MED ORDER — ALBUTEROL SULFATE HFA 108 (90 BASE) MCG/ACT IN AERS: 1.0000 | INHALATION_SPRAY | Freq: Four times a day (QID) | RESPIRATORY_TRACT | 0 refills | Status: AC | PRN

## 2024-07-16 NOTE — Patient Instructions (Signed)
 Christy Chang, thank you for joining Delon CHRISTELLA Dickinson, PA-C for today's virtual visit.  While this provider is not your primary care provider (PCP), if your PCP is located in our provider database this encounter information will be shared with them immediately following your visit.   A [facility] MyChart account gives you access to today's visit and all your visits, tests, and labs performed at [facility]  click here if you don't have a [facility] MyChart account or go to [email or web address]  Consent: (Patient) Christy Chang provided verbal consent for this virtual visit at the beginning of the encounter.  Current Medications:  Current Outpatient Medications:    albuterol  (VENTOLIN  HFA) 108 (90 Base) MCG/ACT inhaler, Inhale 1-2 puffs into the lungs every 6 (six) hours as needed., Disp: 8 g, Rfl: 0   benzonatate  (TESSALON ) 100 MG capsule, Take 1-2 capsules (100-200 mg total) by mouth 3 (three) times daily as needed., Disp: 30 capsule, Rfl: 0   predniSONE  (STERAPRED UNI-PAK 21 TAB) 10 MG (21) TBPK tablet, 6 day taper; take as directed on package instructions, Disp: 21 tablet, Rfl: 0   promethazine -dextromethorphan (PROMETHAZINE -DM) 6.25-15 MG/5ML syrup, Take 5 mLs by mouth 4 (four) times daily as needed., Disp: 118 mL, Rfl: 0   desvenlafaxine (PRISTIQ) 50 MG 24 hr tablet, Take 50 mg by mouth daily., Disp: , Rfl:    losartan  (COZAAR ) 50 MG tablet, Take 1 tablet (50 mg total) by mouth daily., Disp: 90 tablet, Rfl: 3   Multiple Vitamin (MULTIVITAMIN) tablet, Take 1 tablet by mouth daily. Reported on 01/25/2016, Disp: , Rfl:    Medications ordered in this encounter:  Meds ordered this encounter  Medications   albuterol  (VENTOLIN  HFA) 108 (90 Base) MCG/ACT inhaler    Sig: Inhale 1-2 puffs into the lungs every 6 (six) hours as needed.    Dispense:  8 g    Refill:  0    Supervising Provider:   BLAISE ALEENE KIDD [[id number]]   predniSONE  (STERAPRED UNI-PAK 21 TAB) 10 MG (21) TBPK  tablet    Sig: 6 day taper; take as directed on package instructions    Dispense:  21 tablet    Refill:  0    Supervising Provider:   LAMPTEY, PHILIP O [[id number]]   promethazine -dextromethorphan (PROMETHAZINE -DM) 6.25-15 MG/5ML syrup    Sig: Take 5 mLs by mouth 4 (four) times daily as needed.    Dispense:  118 mL    Refill:  0    Supervising Provider:   BLAISE ALEENE KIDD [[id number]]   benzonatate  (TESSALON ) 100 MG capsule    Sig: Take 1-2 capsules (100-200 mg total) by mouth 3 (three) times daily as needed.    Dispense:  30 capsule    Refill:  0    Supervising Provider:   BLAISE ALEENE KIDD [[id number]]     *If you need refills on other medications prior to your next appointment, please contact your pharmacy*  Follow-Up: Call back or seek an in-person evaluation if the symptoms worsen or if the condition fails to improve as anticipated.  [facility] [phone]  Other Instructions Acute Bronchitis, Adult  Acute bronchitis is sudden inflammation of the main airways (bronchi) that come off the windpipe (trachea) in the lungs. The swelling causes the airways to get smaller and make more mucus than normal. This can make it hard to breathe and can cause coughing or noisy breathing (wheezing). Acute bronchitis may last several weeks. The cough may last longer. Allergies,  asthma, and exposure to smoke may make the condition worse. What are the causes? This condition can be caused by germs and by substances that irritate the lungs, including: Cold and flu viruses. The most common cause of this condition is the virus that causes the common cold. Bacteria. This is less common. Breathing in substances that irritate the lungs, including: Smoke from cigarettes and other forms of tobacco. Dust and pollen. Fumes from household cleaning products, gases, or burned fuel. Indoor or outdoor air pollution. What increases the risk? The following factors may make you more likely to develop  this condition: A weak body's defense system, also called the immune system. A condition that affects your lungs and breathing, such as asthma. What are the signs or symptoms? Common symptoms of this condition include: Coughing. This may bring up clear, yellow, or green mucus from your lungs (sputum). Wheezing. Runny or stuffy nose. Having too much mucus in your lungs (chest congestion). Shortness of breath. Aches and pains, including sore throat or chest. How is this diagnosed? This condition is usually diagnosed based on: Your symptoms and medical history. A physical exam. You may also have other tests, including tests to rule out other conditions, such as pneumonia. These tests include: A test of lung function. Test of a mucus sample to look for the presence of bacteria. Tests to check the oxygen level in your blood. Blood tests. Chest X-ray. How is this treated? Most cases of acute bronchitis clear up over time without treatment. Your health care provider may recommend: Drinking more fluids to help thin your mucus so it is easier to cough up. Taking inhaled medicine (inhaler) to improve air flow in and out of your lungs. Using a vaporizer or a humidifier. These are machines that add water to the air to help you breathe better. Taking a medicine that thins mucus and clears congestion (expectorant). Taking a medicine that prevents or stops coughing (cough suppressant). It is not common to take an antibiotic medicine for this condition. Follow these instructions at home:  Take over-the-counter and prescription medicines only as told by your health care provider. Use an inhaler, vaporizer, or humidifier as told by your health care provider. Take two teaspoons (10 mL) of honey at bedtime to lessen coughing at night. Drink enough fluid to keep your urine pale yellow. Do not use any products that contain nicotine or tobacco. These products include cigarettes, chewing tobacco, and  vaping devices, such as e-cigarettes. If you need help quitting, ask your health care provider. Get plenty of rest. Return to your normal activities as told by your health care provider. Ask your health care provider what activities are safe for you. Keep all follow-up visits. This is important. How is this prevented? To lower your risk of getting this condition again: Wash your hands often with soap and water for at least 20 seconds. If soap and water are not available, use hand sanitizer. Avoid contact with people who have cold symptoms. Try not to touch your mouth, nose, or eyes with your hands. Avoid breathing in smoke or chemical fumes. Breathing smoke or chemical fumes will make your condition worse. Get the flu shot every year. Contact a health care provider if: Your symptoms do not improve after 2 weeks. You have trouble coughing up the mucus. Your cough keeps you awake at night. You have a fever. Get help right away if you: Cough up blood. Feel pain in your chest. Have severe shortness of breath. Faint or keep  feeling like you are going to faint. Have a severe headache. Have a fever or chills that get worse. These symptoms may represent a serious problem that is an emergency. Do not wait to see if the symptoms will go away. Get medical help right away. Call your local emergency services (911 in the [place] Do not drive yourself to the hospital. Summary Acute bronchitis is inflammation of the main airways (bronchi) that come off the windpipe (trachea) in the lungs. The swelling causes the airways to get smaller and make more mucus than normal. Drinking more fluids can help thin your mucus so it is easier to cough up. Take over-the-counter and prescription medicines only as told by your health care provider. Do not use any products that contain nicotine or tobacco. These products include cigarettes, chewing tobacco, and vaping devices, such as e-cigarettes. If you need help  quitting, ask your health care provider. Contact a health care provider if your symptoms do not improve after 2 weeks. This information is not intended to replace advice given to you by your health care provider. Make sure you discuss any questions you have with your health care provider. Document Revised: 03/09/2022 Document Reviewed: 03/30/2021 Elsevier Patient Education  2024 [employer] Inc.   If you have been instructed to have an in-person evaluation today at a local Urgent Care facility, please use the link below. It will take you to a list of all of our available [facility], including address, phone number and hours of operation. Please do not delay care.  [facility]  If you or a family member do not have a primary care provider, use the link below to schedule a visit and establish care. When you choose a [facility] primary care physician or advanced practice provider, you gain a long-term partner in health. Find a Primary Care Provider  Learn more about [facility]'s in-office and virtual care options: [facility] - Get Care Now

## 2024-07-16 NOTE — Progress Notes (Signed)
 Virtual Visit Consent   Christy Chang, you are scheduled for a virtual visit with a [facility] provider today. Just as with appointments in the office, your consent must be obtained to participate. Your consent will be active for this visit and any virtual visit you may have with one of our providers in the next 365 days. If you have a MyChart account, a copy of this consent can be sent to you electronically.  As this is a virtual visit, video technology does not allow for your provider to perform a traditional examination. This may limit your provider's ability to fully assess your condition. If your provider identifies any concerns that need to be evaluated in person or the need to arrange testing (such as labs, EKG, etc.), we will make arrangements to do so. Although advances in technology are sophisticated, we cannot ensure that it will always work on either your end or our end. If the connection with a video visit is poor, the visit may have to be switched to a telephone visit. With either a video or telephone visit, we are not always able to ensure that we have a secure connection.  By engaging in this virtual visit, you consent to the provision of healthcare and authorize for your insurance to be billed (if applicable) for the services provided during this visit. Depending on your insurance coverage, you may receive a charge related to this service.  I need to obtain your verbal consent now. Are you willing to proceed with your visit today? Christy Chang has provided verbal consent on 07/16/2024 for a virtual visit (video or telephone). Christy CHRISTELLA Dickinson, PA-C  Date: 07/16/2024 2:19 PM   Virtual Visit via Video Note   IDelon CHRISTELLA Chang, connected with  Christy Chang  ([id number], Mar 26, 1976) on 07/16/24 at  2:15 PM EDT by a video-enabled telemedicine application and verified that I am speaking with the correct person using two identifiers.  Location: Patient: Virtual Visit Location Patient:  Home Provider: Virtual Visit Location Provider: Home Office   I discussed the limitations of evaluation and management by telemedicine and the availability of in person appointments. The patient expressed understanding and agreed to proceed.    History of Present Illness: Christy Chang is a 48 y.o. who identifies as a female who was assigned female at birth, and is being seen today for cough and congestion.  HPI: Cough This is a new problem. The current episode started 1 to 4 weeks ago (just over a week). The problem has been gradually worsening. The problem occurs every few minutes. The cough is Non-productive. Associated symptoms include a fever (low grade for about 1 days), headaches, a sore throat (initial symptom; now improved) and wheezing (some at night with expiration). Pertinent negatives include no chest pain or chills. The symptoms are aggravated by lying down. Treatments tried: cough drops, nyquil, dayquil. The treatment provided no relief. Her past medical history is significant for bronchitis.     Problems:  Patient Active Problem List   Diagnosis Date Noted   Anxiety and depression 11/19/2023   Dyslipidemia 09/29/2021   Primary hypertension 09/29/2021   ANEMIA-IRON DEFICIENCY 12/28/2010   Benign neoplasm of skin 09/08/2008   GERD 09/17/2007    Allergies:  Allergies  Allergen Reactions   Lisinopril  Cough   Sulfa Antibiotics    Medications:  Current Outpatient Medications:    albuterol  (VENTOLIN  HFA) 108 (90 Base) MCG/ACT inhaler, Inhale 1-2 puffs into the lungs every 6 (six)  hours as needed., Disp: 8 g, Rfl: 0   benzonatate  (TESSALON ) 100 MG capsule, Take 1-2 capsules (100-200 mg total) by mouth 3 (three) times daily as needed., Disp: 30 capsule, Rfl: 0   predniSONE  (STERAPRED UNI-PAK 21 TAB) 10 MG (21) TBPK tablet, 6 day taper; take as directed on package instructions, Disp: 21 tablet, Rfl: 0   promethazine -dextromethorphan (PROMETHAZINE -DM) 6.25-15 MG/5ML syrup, Take 5  mLs by mouth 4 (four) times daily as needed., Disp: 118 mL, Rfl: 0   desvenlafaxine (PRISTIQ) 50 MG 24 hr tablet, Take 50 mg by mouth daily., Disp: , Rfl:    losartan  (COZAAR ) 50 MG tablet, Take 1 tablet (50 mg total) by mouth daily., Disp: 90 tablet, Rfl: 3   Multiple Vitamin (MULTIVITAMIN) tablet, Take 1 tablet by mouth daily. Reported on 01/25/2016, Disp: , Rfl:   Observations/Objective: Patient is well-developed, well-nourished in no acute distress.  Resting comfortably at home.  Head is normocephalic, atraumatic.  No labored breathing.  Speech is clear and coherent with logical content.  Patient is alert and oriented at baseline.  Dry, hacking cough heard a few times through call but not affecting speech  Assessment and Plan: 1. Viral bronchitis (Primary) - albuterol  (VENTOLIN  HFA) 108 (90 Base) MCG/ACT inhaler; Inhale 1-2 puffs into the lungs every 6 (six) hours as needed.  Dispense: 8 g; Refill: 0 - predniSONE  (STERAPRED UNI-PAK 21 TAB) 10 MG (21) TBPK tablet; 6 day taper; take as directed on package instructions  Dispense: 21 tablet; Refill: 0 - promethazine -dextromethorphan (PROMETHAZINE -DM) 6.25-15 MG/5ML syrup; Take 5 mLs by mouth 4 (four) times daily as needed.  Dispense: 118 mL; Refill: 0 - benzonatate  (TESSALON ) 100 MG capsule; Take 1-2 capsules (100-200 mg total) by mouth 3 (three) times daily as needed.  Dispense: 30 capsule; Refill: 0  - Worsening over a week despite OTC medications - Will treat with Prednisone , Albuterol  inhaler, Promethazine  DM, and tessalon  perles - Can continue Mucinex  (PLAIN) - Push fluids.  - Rest.  - Steam and humidifier can help - Seek in person evaluation if worsening or symptoms fail to improve    Follow Up Instructions: I discussed the assessment and treatment plan with the patient. The patient was provided an opportunity to ask questions and all were answered. The patient agreed with the plan and demonstrated an understanding of the  instructions.  A copy of instructions were sent to the patient via MyChart unless otherwise noted below.    The patient was advised to call back or seek an in-person evaluation if the symptoms worsen or if the condition fails to improve as anticipated.    Christy CHRISTELLA Dickinson, PA-C

## 2024-07-17 ENCOUNTER — Ambulatory Visit: Admitting: Family Medicine

## 2024-07-22 ENCOUNTER — Encounter: Payer: Self-pay | Admitting: Physician Assistant

## 2024-07-25 ENCOUNTER — Ambulatory Visit
Admission: RE | Admit: 2024-07-25 | Discharge: 2024-07-25 | Disposition: A | Discharge status: Home / Self Care | Source: Ambulatory Visit | Attending: Nurse Practitioner

## 2024-07-25 VITALS — BP 147/97 | HR 88 | Temp 97.9°F | Resp 20

## 2024-07-25 DIAGNOSIS — R059 Cough, unspecified: Secondary | ICD-10-CM

## 2024-07-25 DIAGNOSIS — J22 Unspecified acute lower respiratory infection: Secondary | ICD-10-CM

## 2024-07-25 MED ORDER — AZITHROMYCIN 250 MG PO TABS: 250.0000 mg | ORAL_TABLET | Freq: Every day | ORAL | 0 refills | Status: DC

## 2024-07-25 MED ORDER — PROMETHAZINE-DM 6.25-15 MG/5ML PO SYRP: 5.0000 mL | ORAL_SOLUTION | Freq: Four times a day (QID) | ORAL | 0 refills | Status: DC | PRN

## 2024-07-25 MED ORDER — PREDNISONE 20 MG PO TABS: 40.0000 mg | ORAL_TABLET | Freq: Every day | ORAL | 0 refills | Status: AC

## 2024-07-25 NOTE — ED Triage Notes (Signed)
 Pt reports cough, chest tightness, some difficulty breathing, congestion x 2 weeks had virtual visit was given Prednisone  Taper, Tessalon  Perles, an Albuterol  inhaler, and Promethazine  DM. Pt states she has also used OTC meds. All treatment had subsided symptoms shortly but they have returned.

## 2024-07-25 NOTE — ED Provider Notes (Signed)
 [facility]    CSN: [id number] Arrival date & time: 07/25/24  1145      History   Chief Complaint Chief Complaint  Patient presents with   Cough    Treated for bronchitis and symptoms have returned - Entered by patient    HPI Christy Chang is a 48 y.o. female.   The history is provided by the patient.   Patient presents for cough, increased fatigue, chest tightness, shortness of breath, and chest congestion.  Patient states symptoms started approximately 2 weeks ago.  States that she completed an e-visit and was prescribed prednisone , Tessalon , and albuterol  inhaler, and Promethazine  DM.  She states that symptoms initially improved, but seem to be worsening over the past several days.  She denies fever, chills, headache, ear pain, sore throat, abdominal pain, nausea, vomiting, diarrhea, or rash.  Patient denies history of smoking or asthma.  States that she has completed the prednisone  and Promethazine  DM.  Past Medical History:  Diagnosis Date   Allergy    Anemia    GERD (gastroesophageal reflux disease)    UTI (lower urinary tract infection)     Patient Active Problem List   Diagnosis Date Noted   Anxiety and depression 11/19/2023   Dyslipidemia 09/29/2021   Primary hypertension 09/29/2021   ANEMIA-IRON DEFICIENCY 12/28/2010   Benign neoplasm of skin 09/08/2008   GERD 09/17/2007    Past Surgical History:  Procedure Laterality Date   ABDOMINAL HYSTERECTOMY     FOOT SURGERY      OB History   No obstetric history on file.      Home Medications    Prior to Admission medications   Medication Sig Start Date End Date Taking? Authorizing Provider  albuterol  (VENTOLIN  HFA) 108 (90 Base) MCG/ACT inhaler Inhale 1-2 puffs into the lungs every 6 (six) hours as needed. 07/16/24   Vivienne Delon HERO, PA-C  benzonatate  (TESSALON ) 100 MG capsule Take 1-2 capsules (100-200 mg total) by mouth 3 (three) times daily as needed. 07/16/24   Vivienne Delon HERO, PA-C   desvenlafaxine (PRISTIQ) 50 MG 24 hr tablet Take 50 mg by mouth daily.    [provider]  losartan  (COZAAR ) 50 MG tablet Take 1 tablet (50 mg total) by mouth daily. 11/19/23   Johnny Garnette LABOR, MD  Multiple Vitamin (MULTIVITAMIN) tablet Take 1 tablet by mouth daily. Reported on 01/25/2016    [provider]  predniSONE  (STERAPRED UNI-PAK 21 TAB) 10 MG (21) TBPK tablet 6 day taper; take as directed on package instructions 07/16/24   Vivienne Delon HERO, PA-C  promethazine -dextromethorphan (PROMETHAZINE -DM) 6.25-15 MG/5ML syrup Take 5 mLs by mouth 4 (four) times daily as needed. 07/16/24   Vivienne Delon HERO, PA-C    Family History Family History  Problem Relation Age of Onset   Heart disease Mother    Hypertension Father    Multiple sclerosis Other    Cancer Other        colon 1st degree relative <60   Hypertension Other    Kidney disease Other    Lung cancer Other    Ulcers Other     Social History Social History   Tobacco Use   Smoking status: Never   Smokeless tobacco: Never  Substance Use Topics   Alcohol use: Yes    Alcohol/week: 1.0 standard drink of alcohol    Types: 1 Standard drinks or equivalent per week    Comment: occ   Drug use: No     Allergies  Lisinopril  and Sulfa antibiotics   Review of Systems Review of Systems Per HPI  Physical Exam Triage Vital Signs ED Triage Vitals  Encounter Vitals Group     BP 07/25/24 1223 (!) 147/97     Girls Systolic BP Percentile --      Girls Diastolic BP Percentile --      Boys Systolic BP Percentile --      Boys Diastolic BP Percentile --      Pulse Rate 07/25/24 1223 88     Resp 07/25/24 1223 20     Temp 07/25/24 1223 97.9 F (36.6 C)     Temp Source 07/25/24 1223 Oral     SpO2 07/25/24 1223 96 %     Weight --      Height --      Head Circumference --      Peak Flow --      Pain Score 07/25/24 1228 0     Pain Loc --      Pain Education --      Exclude from Growth Chart --    No data  found.  Updated Vital Signs BP (!) 147/97 (BP Location: Right Arm)   Pulse 88   Temp 97.9 F (36.6 C) (Oral)   Resp 20   LMP 03/24/2019   SpO2 96%   Visual Acuity Right Eye Distance:   Left Eye Distance:   Bilateral Distance:    Right Eye Near:   Left Eye Near:    Bilateral Near:     Physical Exam Vitals and nursing note reviewed.  Constitutional:      General: She is not in acute distress.    Appearance: Normal appearance.  HENT:     Head: Normocephalic.     Right Ear: Tympanic membrane, ear canal and external ear normal.     Left Ear: Tympanic membrane, ear canal and external ear normal.     Nose: Nose normal.     Mouth/Throat:     Mouth: Mucous membranes are moist.  Eyes:     Extraocular Movements: Extraocular movements intact.     Conjunctiva/sclera: Conjunctivae normal.     Pupils: Pupils are equal, round, and reactive to light.  Cardiovascular:     Rate and Rhythm: Normal rate.     Pulses: Normal pulses.     Heart sounds: Normal heart sounds.  Pulmonary:     Effort: Pulmonary effort is normal. No respiratory distress.     Breath sounds: Normal breath sounds. No stridor. No wheezing, rhonchi or rales.     Comments: Patient with persistent coughing during exam.  No wheezing, rales, or rhonchi heard with auscultation. Abdominal:     General: Bowel sounds are normal.     Palpations: Abdomen is soft.     Tenderness: There is no abdominal tenderness.  Musculoskeletal:     Cervical back: Normal range of motion.  Skin:    General: Skin is warm and dry.  Neurological:     General: No focal deficit present.     Mental Status: She is alert and oriented to person, place, and time.  Psychiatric:        Mood and Affect: Mood normal.        Behavior: Behavior normal.      UC Treatments / Results  Labs (all labs ordered are listed, but only abnormal results are displayed) Labs Reviewed - No data to display  EKG   Radiology No results  found.  Procedures Procedures (including  critical care time)  Medications Ordered in UC Medications - No data to display  Initial Impression / Assessment and Plan / UC Course  I have reviewed the triage vital signs and the nursing notes.  Pertinent labs & imaging results that were available during my care of the patient were reviewed by me and considered in my medical decision making (see chart for details).  On exam, lung sounds were clear throughout, room air sats at 96%.  Patient with persistent coughing during her exam today. No wheezing, rales, or rhonchi observed during the exam.  Will treat patient for lower respiratory infection with azithromycin  250 mg, prednisone  40 mg, and Promethazine  DM for the cough.  Supportive care recommendations were provided and discussed with the patient to include fluids, rest, over-the-counter analgesics, and use of a humidifier in her bedroom at nighttime during sleep.  Discussed indications with patient regarding follow-up.  Patient was in agreement with this plan of care and verbalizes understanding.  All questions were answered.  Patient stable for discharge.  Final Clinical Impressions(s) / UC Diagnoses   Final diagnoses:  None   Discharge Instructions   None    ED Prescriptions   None    PDMP not reviewed this encounter.   Gilmer Etta PARAS, NP 07/25/24 1255

## 2024-07-25 NOTE — Discharge Instructions (Signed)
 Take medication as prescribed. Increase fluids and allow for plenty of rest. You may take over-the-counter Tylenol  or ibuprofen as needed for pain, fever, or general discomfort. Recommend use of a humidifier in your bedroom at nighttime during sleep and sleeping elevated on pillows while cough symptoms persist. As discussed, your cough may linger from days to weeks.  As long as you are generally feeling well, you may continue treatment with over-the-counter cough medications or cough drops.  Seek care if you develop new symptoms such as fever, chills, wheezing, difficulty breathing, or other concerns. Follow-up as needed.

## 2024-08-25 ENCOUNTER — Ambulatory Visit: Payer: Self-pay

## 2024-08-25 NOTE — Telephone Encounter (Signed)
 FYI Only or Action Required?: FYI only for provider.  Patient was last seen in primary care on 07/16/2024 by Vivienne Delon HERO, PA-C.  Called Nurse Triage reporting Hypertension.  Symptoms began yesterday.  Interventions attempted: Prescription medications: losartan .  Symptoms are: hypertension, mild headache, mild dizziness stable; nausea (resolved)  Triage Disposition: See PCP When Office is Open (Within 3 Days)  Patient/caregiver understands and will follow disposition?: Yes            Copied from CRM #[id number]. Topic: Clinical - Red Word Triage >> Aug 25, 2024  7:59 AM Pinkey ORN wrote: Red Word that prompted transfer to Nurse Triage: High Blood Pressure >> Aug 25, 2024  8:00 AM Pinkey ORN wrote: Patient states her blood pressure is currently reading 158/190 Reason for Disposition  Systolic BP >= 160 OR Diastolic >= 100  Answer Assessment - Initial Assessment Questions 1. BLOOD PRESSURE: What is your blood pressure? Did you take at least two measurements 5 minutes apart?     162/97. She states BP has been running high since yesterday morning (feeling lightheaded, nausea and states SBP was 160s/90s yesterday; headache on Friday).  2. ONSET: When did you take your blood pressure?     0745  3. HOW: How did you take your blood pressure? (e.g., automatic home BP monitor, visiting nurse)     Automatic home BP monitor.  4. HISTORY: Do you have a history of high blood pressure?     Yes.  5. MEDICINES: Are you taking any medicines for blood pressure? Have you missed any doses recently?     Losartan , yes took this morning 0515. No missed doses recently.  6. OTHER SYMPTOMS: Do you have any symptoms? (e.g., blurred vision, chest pain, difficulty breathing, headache, weakness)     Denies chest pain, SOB, changes in speech or vision, unilateral numbness or weakness. 2/10 headache, mild dizziness/lightheaded (walking around without unsteady gait or falling  over), nausea (yesterday and has resolved since last night). Patient also adds she has had bronchitis and COVID in the past month.  7. PREGNANCY: Is there any chance you are pregnant? When was your last menstrual period?     Years, she states she has had surgery.  Protocols used: Blood Pressure - High-A-AH

## 2024-08-25 NOTE — Telephone Encounter (Signed)
 Noted

## 2024-08-26 ENCOUNTER — Encounter: Payer: Self-pay | Admitting: Family Medicine

## 2024-08-26 ENCOUNTER — Ambulatory Visit: Admitting: Family Medicine

## 2024-08-26 VITALS — BP 152/96 | HR 96 | Temp 98.3°F | Wt 223.0 lb

## 2024-08-26 DIAGNOSIS — Z136 Encounter for screening for cardiovascular disorders: Secondary | ICD-10-CM

## 2024-08-26 DIAGNOSIS — I10 Essential (primary) hypertension: Secondary | ICD-10-CM | POA: Diagnosis not present

## 2024-08-26 DIAGNOSIS — Z0000 Encounter for general adult medical examination without abnormal findings: Secondary | ICD-10-CM

## 2024-08-26 MED ORDER — LOSARTAN POTASSIUM-HCTZ 100-12.5 MG PO TABS: 1.0000 | ORAL_TABLET | Freq: Every day | ORAL | 3 refills | Status: AC

## 2024-08-26 NOTE — Progress Notes (Signed)
   Subjective:    Patient ID: Christy Chang, female    DOB: 02/09/1976, 48 y.o.   MRN: [id number]  HPI Here for high BP readings for the past week. She has had readings as high as 160/100. She has had some mild dizziness ands headaches, but no SOB or chest pain. No ankle swelling.    Review of Systems  Constitutional: Negative.   Respiratory: Negative.    Cardiovascular: Negative.   Neurological:  Positive for dizziness and headaches.       Objective:   Physical Exam Constitutional:      Appearance: Normal appearance. She is not ill-appearing.  Cardiovascular:     Rate and Rhythm: Normal rate and regular rhythm.     Pulses: Normal pulses.     Heart sounds: Normal heart sounds.  Pulmonary:     Effort: Pulmonary effort is normal.     Breath sounds: Normal breath sounds.  Musculoskeletal:     Right lower leg: No edema.     Left lower leg: No edema.  Neurological:     General: No focal deficit present.     Mental Status: She is alert and oriented to person, place, and time.           Assessment & Plan:  Her HTN is not wel controlled. We will stop Losartan  50 mg and she will start taking Losartan  HCT 100-12.5 daily. She never had her labs drawn from last winter, so she will have these drawn today. She will report back in 2-3 weeks.  Garnette Olmsted, MD

## 2024-08-27 LAB — TSH: TSH: 1 u[IU]/mL (ref 0.35–5.50)

## 2024-08-27 LAB — HEPATIC FUNCTION PANEL
ALT: 16 U/L (ref 0–35)
AST: 20 U/L (ref 0–37)
Albumin: 4.5 g/dL (ref 3.5–5.2)
Alkaline Phosphatase: 67 U/L (ref 39–117)
Bilirubin, Direct: 0.1 mg/dL (ref 0.0–0.3)
Total Bilirubin: 0.4 mg/dL (ref 0.2–1.2)
Total Protein: 7.3 g/dL (ref 6.0–8.3)

## 2024-08-27 LAB — CBC WITH DIFFERENTIAL/PLATELET
Basophils Absolute: 0 K/uL (ref 0.0–0.1)
Basophils Relative: 0.4 % (ref 0.0–3.0)
Eosinophils Absolute: 0.1 K/uL (ref 0.0–0.7)
Eosinophils Relative: 2 % (ref 0.0–5.0)
HCT: 40.2 % (ref 36.0–46.0)
Hemoglobin: 13.2 g/dL (ref 12.0–15.0)
Lymphocytes Relative: 29.3 % (ref 12.0–46.0)
Lymphs Abs: 2.1 K/uL (ref 0.7–4.0)
MCHC: 32.8 g/dL (ref 30.0–36.0)
MCV: 88.6 fl (ref 78.0–100.0)
Monocytes Absolute: 0.6 K/uL (ref 0.1–1.0)
Monocytes Relative: 8.1 % (ref 3.0–12.0)
Neutro Abs: 4.3 K/uL (ref 1.4–7.7)
Neutrophils Relative %: 60.2 % (ref 43.0–77.0)
Platelets: 322 K/uL (ref 150.0–400.0)
RBC: 4.53 Mil/uL (ref 3.87–5.11)
RDW: 14.4 % (ref 11.5–15.5)
WBC: 7.2 K/uL (ref 4.0–10.5)

## 2024-08-27 LAB — BASIC METABOLIC PANEL WITH GFR
BUN: 12 mg/dL (ref 6–23)
CO2: 28 meq/L (ref 19–32)
Calcium: 9.8 mg/dL (ref 8.4–10.5)
Chloride: 100 meq/L (ref 96–112)
Creatinine, Ser: 0.67 mg/dL (ref 0.40–1.20)
GFR: 103.57 mL/min (ref >60.00)
Glucose, Bld: 80 mg/dL (ref 70–99)
Potassium: 3.7 meq/L (ref 3.5–5.1)
Sodium: 137 meq/L (ref 135–145)

## 2024-08-27 LAB — LIPID PANEL
Cholesterol: 171 mg/dL (ref 0–200)
HDL: 67.7 mg/dL (ref >39.00)
LDL Cholesterol: 84 mg/dL (ref 0–99)
NonHDL: 103.74
Total CHOL/HDL Ratio: 3
Triglycerides: 100 mg/dL (ref 0.0–149.0)
VLDL: 20 mg/dL (ref 0.0–40.0)

## 2024-08-27 LAB — HEMOGLOBIN A1C: Hgb A1c MFr Bld: 5.7 % (ref 4.6–6.5)

## 2024-08-28 ENCOUNTER — Ambulatory Visit: Payer: Self-pay | Admitting: Family Medicine

## 2024-11-11 ENCOUNTER — Other Ambulatory Visit: Payer: Self-pay | Admitting: Family Medicine

## 2024-11-11 DIAGNOSIS — I10 Essential (primary) hypertension: Secondary | ICD-10-CM
